# Patient Record
Sex: Male | Born: 1985 | Race: White | Hispanic: No | Smoking: Current every day smoker
Health system: Southern US, Community
[De-identification: ages and names within clinical notes are randomized; demographics above are authoritative.]

## PROBLEM LIST (undated history)

## (undated) DIAGNOSIS — S34109A Unspecified injury to unspecified level of lumbar spinal cord, initial encounter: Secondary | ICD-10-CM

## (undated) DIAGNOSIS — M549 Dorsalgia, unspecified: Secondary | ICD-10-CM

## (undated) DIAGNOSIS — J683 Other acute and subacute respiratory conditions due to chemicals, gases, fumes and vapors: Secondary | ICD-10-CM

## (undated) DIAGNOSIS — F909 Attention-deficit hyperactivity disorder, unspecified type: Secondary | ICD-10-CM

## (undated) DIAGNOSIS — S32008A Other fracture of unspecified lumbar vertebra, initial encounter for closed fracture: Secondary | ICD-10-CM

## (undated) DIAGNOSIS — F1110 Opioid abuse, uncomplicated: Secondary | ICD-10-CM

## (undated) DIAGNOSIS — A879 Viral meningitis, unspecified: Secondary | ICD-10-CM

## (undated) HISTORY — DX: Viral meningitis, unspecified: A87.9

## (undated) HISTORY — DX: Unspecified injury to unspecified level of lumbar spinal cord, initial encounter: S34.109A

## (undated) HISTORY — DX: Dorsalgia, unspecified: M54.9

## (undated) HISTORY — DX: Other acute and subacute respiratory conditions due to chemicals, gases, fumes and vapors: J68.3

## (undated) HISTORY — DX: Other fracture of unspecified lumbar vertebra, initial encounter for closed fracture: S32.008A

---
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
## 2006-07-22 ENCOUNTER — Emergency Department: Payer: Self-pay | Admitting: Emergency Medicine

## 2007-03-14 ENCOUNTER — Emergency Department (HOSPITAL_COMMUNITY): Admission: EM | Admit: 2007-03-14 | Discharge: 2007-03-14 | Payer: Self-pay | Admitting: Emergency Medicine

## 2008-04-28 ENCOUNTER — Emergency Department (HOSPITAL_COMMUNITY): Admission: EM | Admit: 2008-04-28 | Discharge: 2008-04-28 | Payer: Self-pay | Admitting: Emergency Medicine

## 2008-04-29 ENCOUNTER — Emergency Department (HOSPITAL_COMMUNITY): Admission: EM | Admit: 2008-04-29 | Discharge: 2008-04-29 | Payer: Self-pay | Admitting: Emergency Medicine

## 2008-07-24 ENCOUNTER — Ambulatory Visit: Payer: Self-pay | Admitting: Pain Medicine

## 2008-07-27 ENCOUNTER — Ambulatory Visit: Payer: Self-pay | Admitting: Pain Medicine

## 2008-08-15 ENCOUNTER — Ambulatory Visit: Payer: Self-pay | Admitting: Physician Assistant

## 2008-08-24 ENCOUNTER — Ambulatory Visit: Payer: Self-pay | Admitting: Pain Medicine

## 2008-09-08 DIAGNOSIS — S32008A Other fracture of unspecified lumbar vertebra, initial encounter for closed fracture: Secondary | ICD-10-CM

## 2008-09-08 DIAGNOSIS — S34109A Unspecified injury to unspecified level of lumbar spinal cord, initial encounter: Secondary | ICD-10-CM

## 2008-09-08 HISTORY — DX: Other fracture of unspecified lumbar vertebra, initial encounter for closed fracture: S34.109A

## 2008-09-08 HISTORY — DX: Other fracture of unspecified lumbar vertebra, initial encounter for closed fracture: S32.008A

## 2008-09-26 ENCOUNTER — Ambulatory Visit: Payer: Self-pay | Admitting: Physician Assistant

## 2008-10-23 ENCOUNTER — Ambulatory Visit: Payer: Self-pay | Admitting: Pain Medicine

## 2008-11-02 ENCOUNTER — Ambulatory Visit: Payer: Self-pay | Admitting: Pain Medicine

## 2008-11-23 ENCOUNTER — Ambulatory Visit: Payer: Self-pay | Admitting: Physician Assistant

## 2008-12-20 ENCOUNTER — Ambulatory Visit: Payer: Self-pay | Admitting: Physician Assistant

## 2009-02-06 ENCOUNTER — Ambulatory Visit: Payer: Self-pay | Admitting: Pain Medicine

## 2009-03-05 ENCOUNTER — Ambulatory Visit: Payer: Self-pay | Admitting: Pain Medicine

## 2009-04-02 ENCOUNTER — Ambulatory Visit: Payer: Self-pay | Admitting: Pain Medicine

## 2009-04-30 ENCOUNTER — Ambulatory Visit: Payer: Self-pay | Admitting: Pain Medicine

## 2009-07-02 ENCOUNTER — Ambulatory Visit: Payer: Self-pay | Admitting: Physician Assistant

## 2009-10-29 ENCOUNTER — Ambulatory Visit: Payer: Self-pay | Admitting: Pain Medicine

## 2009-10-29 ENCOUNTER — Encounter: Payer: Self-pay | Admitting: Family Medicine

## 2009-11-18 ENCOUNTER — Emergency Department (HOSPITAL_COMMUNITY): Admission: EM | Admit: 2009-11-18 | Discharge: 2009-11-19 | Payer: Self-pay | Admitting: Emergency Medicine

## 2010-02-07 ENCOUNTER — Ambulatory Visit: Payer: Self-pay | Admitting: Family Medicine

## 2010-02-07 DIAGNOSIS — G8929 Other chronic pain: Secondary | ICD-10-CM | POA: Insufficient documentation

## 2010-02-07 DIAGNOSIS — M549 Dorsalgia, unspecified: Secondary | ICD-10-CM

## 2010-03-07 ENCOUNTER — Telehealth: Payer: Self-pay | Admitting: Family Medicine

## 2010-04-04 ENCOUNTER — Telehealth: Payer: Self-pay | Admitting: Family Medicine

## 2010-05-08 ENCOUNTER — Telehealth: Payer: Self-pay | Admitting: Family Medicine

## 2010-06-07 ENCOUNTER — Telehealth: Payer: Self-pay | Admitting: Family Medicine

## 2010-07-05 ENCOUNTER — Telehealth: Payer: Self-pay | Admitting: Family Medicine

## 2010-08-07 ENCOUNTER — Telehealth: Payer: Self-pay | Admitting: Family Medicine

## 2010-09-06 ENCOUNTER — Telehealth: Payer: Self-pay | Admitting: Family Medicine

## 2010-10-04 ENCOUNTER — Telehealth: Payer: Self-pay | Admitting: Family Medicine

## 2010-10-09 NOTE — Miscellaneous (Signed)
Summary: Controlled Substance Agreement  Controlled Substance Agreement   Imported By: Lanelle Bal 02/14/2010 09:08:02  _____________________________________________________________________  External Attachment:    Type:   Image     Comment:   External Document

## 2010-10-09 NOTE — Progress Notes (Signed)
Summary: hydrocodone   Phone Note Refill Request Message from:  Patient on August 07, 2010 9:31 AM  Refills Requested: Medication #1:  HYDROCODONE-ACETAMINOPHEN 5-325 MG TABS take 1-2 tabs up to three times a day as needed for pain. Please send to [facility]   Initial call taken by: Melody Comas,  August 07, 2010 9:31 AM  Follow-up for Phone Call        Pt is waiting in our waiting room and would like to pick up rx instead of having med called to [facility]. Pt is going out of town when leaves our office.Please advise. Lewanda Rife LPN  August 07, 2010 12:31 PM  printed in put in nurse in box for pickup  Follow-up by: Judith Part MD,  August 07, 2010 12:47 PM    New/Updated Medications: HYDROCODONE-ACETAMINOPHEN 5-325 MG TABS (HYDROCODONE-ACETAMINOPHEN) take 1-2 tabs up to three times a day as needed for pain Prescriptions: HYDROCODONE-ACETAMINOPHEN 5-325 MG TABS (HYDROCODONE-ACETAMINOPHEN) take 1-2 tabs up to three times a day as needed for pain  #180 x 0   Entered and Authorized by:   Judith Part MD   Signed by:   Judith Part MD on 08/07/2010   Method used:   Print then Give to Patient   RxID:   [id number]

## 2010-10-09 NOTE — Progress Notes (Signed)
Summary: hydrocodone  Phone Note Refill Request Message from:  Patient on April 04, 2010 3:43 PM  Refills Requested: Medication #1:  HYDROCODONE-ACETAMINOPHEN 5-325 MG TABS take 1-2 tabs up to three times a day as needed for pain.  Method Requested: Pick up at Office Initial call taken by: Melody Comas,  April 04, 2010 3:43 PM  Follow-up for Phone Call        printed in put in nurse in box for pickup  Follow-up by: Judith Part MD,  April 05, 2010 8:11 AM  Additional Follow-up for Phone Call Additional follow up Details #1::        Patient notified as instructed by telephone. Prescription left at front desk. Lewanda Rife LPN  April 05, 2010 8:19 AM     Prescriptions: HYDROCODONE-ACETAMINOPHEN 5-325 MG TABS (HYDROCODONE-ACETAMINOPHEN) take 1-2 tabs up to three times a day as needed for pain  #180 x 0   Entered and Authorized by:   Judith Part MD   Signed by:   Judith Part MD on 04/05/2010   Method used:   Print then Give to Patient   RxID:   [id number]

## 2010-10-09 NOTE — Progress Notes (Signed)
Summary: refill request for vicodin  Phone Note Refill Request Call back at [phone] Message from:  mother  Marice Potter Requested: Medication #1:  HYDROCODONE-ACETAMINOPHEN 5-325 MG TABS take 1-2 tabs up to three times a day as needed for pain. Please send to [facility], he will run out of this on sunday   Initial call taken by: Lowella Petties CMA, AAMA,  July 05, 2010 10:16 AM  Follow-up for Phone Call        please call in and notify patient. Follow-up by: Eustaquio Boyden  MD,  July 05, 2010 10:23 AM  Additional Follow-up for Phone Call Additional follow up Details #1::        Rx called in and patient's mother notified Additional Follow-up by: Janee Morn CMA Duncan Dull),  July 05, 2010 10:41 AM    Prescriptions: HYDROCODONE-ACETAMINOPHEN 5-325 MG TABS (HYDROCODONE-ACETAMINOPHEN) take 1-2 tabs up to three times a day as needed for pain  #180 x 0   Entered and Authorized by:   Eustaquio Boyden  MD   Signed by:   Eustaquio Boyden  MD on 07/05/2010   Method used:   Telephoned to ...         RxID:   [id number]

## 2010-10-09 NOTE — Letter (Signed)
Summary: Patient Questionnaire  Patient Questionnaire   Imported By: Beau Fanny 02/11/2010 13:55:30  _____________________________________________________________________  External Attachment:    Type:   Image     Comment:   External Document

## 2010-10-09 NOTE — Letter (Signed)
Summary: [facility]  [facility]   Imported By: Lanelle Bal 11/20/2009 08:25:56  _____________________________________________________________________  External Attachment:    Type:   Image     Comment:   External Document

## 2010-10-09 NOTE — Progress Notes (Signed)
Summary: Percocet  Phone Note Refill Request Call back at [phone], Jacob Newton, Wife Message from:  Patient on March 07, 2010 10:30 AM  Refills Requested: Medication #1:  HYDROCODONE-ACETAMINOPHEN 5-325 MG TABS take 1-2 tabs up to three times a day as needed for pain. Please call patient when prescription is ready for pickup.    Method Requested: Pick up at Office Initial call taken by: Delilah Shan CMA (AAMA),  March 07, 2010 10:30 AM  Follow-up for Phone Call        printed in put in nurse in box for pickup  Follow-up by: Judith Part MD,  March 07, 2010 1:03 PM  Additional Follow-up for Phone Call Additional follow up Details #1::        Left message for patient to call back. Prescription left at front desk. Lewanda Rife LPN  March 07, 2010 5:16 PM   Jacob Newton  notified as instructed by telephone. Lewanda Rife LPN  March 09, 4539 11:17 AM     Prescriptions: HYDROCODONE-ACETAMINOPHEN 5-325 MG TABS (HYDROCODONE-ACETAMINOPHEN) take 1-2 tabs up to three times a day as needed for pain  #180 x 0   Entered and Authorized by:   Judith Part MD   Signed by:   Judith Part MD on 03/07/2010   Method used:   Print then Give to Patient   RxID:   [id number]

## 2010-10-09 NOTE — Assessment & Plan Note (Signed)
Summary: NEW PT TO ESTABH/DLO   Vital Signs:  Patient profile:   25 year old male Height:      66.5 inches Weight:      167.25 pounds BMI:     26.69 Temp:     98.6 degrees F oral Pulse rate:   80 / minute Pulse rhythm:   regular BP sitting:   108 / 70  (right arm) Cuff size:   regular  Vitals Entered By: Lewanda Rife LPN (February 07, 1609 11:30 AM) CC: New pt to establish   History of Present Illness: here to est today  is tranferring also for pain control med from his pain Dr Shireen Quan- who is cutting down his practice pt has also seen Dr Ernest Pine for ortho   hx of low back pain from injury-- fell off a truck in 2009  fx a vertebrae - thinks it was L5  was on workmans comp- just settled for that  per letter from Dr Shireen Quan 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   has had injections - no relief  was told he was too young for surgery - but would consider only if a lot more severe (would have to have a fusion)   is on norco 325-5 orally 1-2 up to three times a day as needed  gets 180 pills per month - will be taking to [facility] or [facility]  out for a while - was due onthe 21st - had no pills  withdrawl for 1 day - not bad (he tolerates drug holidays well )  advil in addn to this- every now and then   his med has been stable approved to continue px under same terms without change or increase (if problems return for visit)  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    Allergies (verified): 1)  ! 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/ disc bulge/ arthropathy  hx of lumbar fracture in 2010 used to go to pain clinic  smoker  gets reactive airways with illnesses        Dr Shireen Quan - pain Dr as needed  ortho -- Dr Ernest Pine, Dr Sheppard Penton   Past Surgical History: Radio frequency for lumbar fracture 2010-  but no surgery  stitches once   Family History: Mother: Living High blood pressure Father: Living: Crohn's disease  Social History: Occupation:Electrician HS ed Married Current Smoker Alcohol use-yes- rarely / on holiday  Drug use-no Regular exercise-yes 4 children (wife stays at home)  Occupation:  employed Smoking Status:  current Drug Use:  no Does Patient Exercise:  yes  Review of Systems General:  Denies fatigue, loss of appetite, and malaise. Eyes:  Denies blurring and eye irritation. CV:  Denies chest pain or discomfort, palpitations, shortness of breath with exertion, and swelling of feet. Resp:  Denies cough, shortness of breath, and wheezing. GI:  Denies abdominal pain, bloody stools, change in bowel habits, and indigestion. GU:  Denies dysuria, hematuria, urinary frequency, and urinary hesitancy. MS:  Complains of low back pain and stiffness; denies muscle weakness. Derm:  Denies  itching, lesion(s), poor wound healing, and rash. Neuro:  Complains of numbness and tingling; in R leg from back injury- this happens while sitting and driving. Psych:  Denies anxiety and depression. Endo:  Denies excessive thirst and excessive urination.  Physical Exam  General:  Well-developed,well-nourished,in no acute distress; alert,appropriate and cooperative throughout examination Head:  normocephalic, atraumatic, and no abnormalities observed.   Eyes:  vision grossly intact, pupils equal, pupils round, and pupils reactive to light.  no conjunctival pallor, injection or icterus  Mouth:  pharynx pink and moist.   Neck:  supple with full rom and no masses or thyromegally, no JVD or carotid bruit  Chest Wall:  No deformities, masses, tenderness or gynecomastia noted. Lungs:  Normal respiratory effort, chest expands symmetrically. Lungs are clear to auscultation, no crackles or wheezes. Heart:  Normal rate and regular rhythm. S1 and S2 normal without gallop, murmur, click, rub or other extra sounds. Abdomen:  Bowel sounds positive,abdomen soft and non-tender without masses, organomegaly or hernias noted. Msk:  tenderness over lower LS with limited rom  tender over R SI joint  nl rom leg and hip gait favors L leg slightly no acute joint changes  Pulses:  R and L carotid,radial,femoral,dorsalis pedis and posterior tibial pulses are full and equal bilaterally Extremities:  No clubbing, cyanosis, edema, or deformity noted with normal full range of motion of all joints.   Neurologic:  no tremor strength normal in all extremities, sensation intact to light touch, and DTRs symmetrical and normal.   Skin:  Intact without suspicious lesions or rashes tanned with some lentigos  Cervical Nodes:  No lymphadenopathy noted Inguinal Nodes:  No significant adenopathy Psych:  normal affect, talkative and pleasant  here with supportive wife today   Impression & Recommendations:  Problem # 1:  BACK  PAIN, CHRONIC (ICD-724.5) Assessment New here to transfer care and med management from pain clinic for chronic back pain after trauma spent 20 min rev old records- pt has been reliable with his pain meds - without much withdrawl if he stops  disc habit forming and sedating potential - he assures me this does not affect his job  disc return to Dr Herminio Heads if symptoms worsen (? consider fusion if worse)  did fill 1 mo of hydrocodone - 180 pills and pt signed controlled subst contract today understands method of calling for refils - will need to call 2 days ahead and mind our guidelines will return for health mt exam when he gets insurance  His updated medication list for this problem includes:    Hydrocodone-acetaminophen 5-325 Mg Tabs (Hydrocodone-acetaminophen) .Marland Kitchen... Take 1-2 tabs up to three  times a day as needed for pain  Complete Medication List: 1)  Hydrocodone-acetaminophen 5-325 Mg Tabs (Hydrocodone-acetaminophen) .... Take 1-2 tabs up to three times a day as needed for pain  Patient Instructions: 1)  no change in medication 2)  for monthly refils - call at least 2 days before refil as needed  3)  update me if pain or other symptoms worsen  4)  come back for physical when you are interested  5)  keep thinking about quitting smoking  6)  let me know if you are interested in zyban in the future  Prescriptions: HYDROCODONE-ACETAMINOPHEN 5-325 MG TABS (HYDROCODONE-ACETAMINOPHEN) take 1-2 tabs up to three times a day as needed for pain  #180 x 0   Entered and Authorized by:   Judith Part MD   Signed by:   Judith Part MD on 02/07/2010   Method used:   Print then Give to Patient   RxID:   [id number]   Current Allergies (reviewed today): ! PERCOCET   Immunization History:  Tetanus/Td Immunization History:    Tetanus/Td:  historical (08/07/2006)

## 2010-10-09 NOTE — Progress Notes (Signed)
Summary: vicodin  Phone Note Refill Request Message from:  Patient on June 07, 2010 9:30 AM  Refills Requested: Medication #1:  HYDROCODONE-ACETAMINOPHEN 5-325 MG TABS take 1-2 tabs up to three times a day as needed for pain. Uses [facility]  Initial call taken by: Melody Comas,  June 07, 2010 9:30 AM  Follow-up for Phone Call        px written on EMR for call in  Follow-up by:  Part MD,  June 07, 2010 12:08 PM  Additional Follow-up for Phone Call Additional follow up Details #1::        Medication phoned to [facility] pharmacy as instructed. Patient notified as instructed by telephone. Lewanda Rife LPN  June 07, 2010 12:53 PM     Prescriptions: HYDROCODONE-ACETAMINOPHEN 5-325 MG TABS (HYDROCODONE-ACETAMINOPHEN) take 1-2 tabs up to three times a day as needed for pain  #180 x 0   Entered and Authorized by:    Part MD   Signed by:   Lewanda Rife LPN on 21/30/8657   Method used:   Telephoned to ...       [facility]* (retail)       [address]       [place], [place]  [address]       Ph: [phone]       Fax: [phone]   RxID:   [id number]

## 2010-10-09 NOTE — Progress Notes (Signed)
Summary: refill refill request for vicodin  Phone Note Refill Request Call back at Home Phone [phone] Message from:  Patient  Refills Requested: Medication #1:  HYDROCODONE-ACETAMINOPHEN 5-325 MG TABS take 1-2 tabs up to three times a day as needed for pain. Phoned request from pt, please send to [facility].  Initial call taken by: Lowella Petties CMA,  May 08, 2010 4:39 PM  Follow-up for Phone Call        I think he usually picks this up printed in put in nurse in box for pickup  Follow-up by: Judith Part MD,  May 08, 2010 4:52 PM  Additional Follow-up for Phone Call Additional follow up Details #1::        Left message for patient to call back. Dr Milinda Antis said pt should pick up rx.Prescription left at front desk.Lewanda Rife LPN  May 08, 2010 5:05 PM  Advised pt script is ready.  Additional Follow-up by: Lowella Petties CMA,  May 09, 2010 8:15 AM    Prescriptions: HYDROCODONE-ACETAMINOPHEN 5-325 MG TABS (HYDROCODONE-ACETAMINOPHEN) take 1-2 tabs up to three times a day as needed for pain  #180 x 0   Entered and Authorized by:   Judith Part MD   Signed by:   Judith Part MD on 05/08/2010   Method used:   Print then Give to Patient   RxID:   [id number]

## 2010-10-10 NOTE — Progress Notes (Signed)
Summary: refill request for vicodin  Phone Note Refill Request Call back at Home Phone [phone] Message from:  Patient  Refills Requested: Medication #1:  HYDROCODONE-ACETAMINOPHEN 5-325 MG TABS take 1-2 tabs up to three times a day as needed for pain. Phoned request from pt, please send to [facility].  Initial call taken by: Lowella Petties CMA, AAMA,  September 06, 2010 9:18 AM  Follow-up for Phone Call        px written on EMR for call in  Follow-up by: Judith Part MD,  September 06, 2010 1:17 PM  Additional Follow-up for Phone Call Additional follow up Details #1::        Rx phoned to pharmacy.  Additional Follow-up by: Melody Comas,  September 06, 2010 2:23 PM    New/Updated Medications: HYDROCODONE-ACETAMINOPHEN 5-325 MG TABS (HYDROCODONE-ACETAMINOPHEN) take 1-2 tabs up to three times a day as needed for pain Prescriptions: HYDROCODONE-ACETAMINOPHEN 5-325 MG TABS (HYDROCODONE-ACETAMINOPHEN) take 1-2 tabs up to three times a day as needed for pain  #180 x 0   Entered and Authorized by:   Judith Part MD   Signed by:   Lewanda Rife LPN on 09/81/1914   Method used:   Telephoned to ...         RxID:   [id number]

## 2010-10-10 NOTE — Progress Notes (Signed)
Summary: hydrocodone  Phone Note Refill Request Call back at Home Phone [phone] Message from:  Fax from Pharmacy on October 04, 2010 8:56 AM  Refills Requested: Medication #1:  HYDROCODONE-ACETAMINOPHEN 5-325 MG TABS take 1-2 tabs up to three times a day as needed for pain. Patient is asking if he can get this fille a couple of days early because he will be going out fo town and for the [employer] and through next week. He would like it called in to [place].   Initial call taken by: Melody Comas,  October 04, 2010 8:57 AM  Follow-up for Phone Call        px written on EMR for call in  Follow-up by: Judith Part MD,  October 04, 2010 11:12 AM  Additional Follow-up for Phone Call Additional follow up Details #1::        Medication phoned to [facility] pharmacy as instructed. Patient notified as instructed by telephone. Lewanda Rife LPN  October 04, 2010 12:42 PM     New/Updated Medications: HYDROCODONE-ACETAMINOPHEN 5-325 MG TABS (HYDROCODONE-ACETAMINOPHEN) take 1-2 tabs up to three times a day as needed for pain Prescriptions: HYDROCODONE-ACETAMINOPHEN 5-325 MG TABS (HYDROCODONE-ACETAMINOPHEN) take 1-2 tabs up to three times a day as needed for pain  #180 x 3   Entered and Authorized by:   Judith Part MD   Signed by:   Lewanda Rife LPN on 08/65/7846   Method used:   Telephoned to ...         RxID:   [id number]

## 2010-11-07 ENCOUNTER — Telehealth: Payer: Self-pay | Admitting: Family Medicine

## 2010-11-14 NOTE — Progress Notes (Signed)
Summary: refill request for vicodin  Phone Note Refill Request Call back at Home Phone [phone] Message from:  Patient  Refills Requested: Medication #1:  HYDROCODONE-ACETAMINOPHEN 5-325 MG TABS take 1-2 tabs up to three times a day as needed for pain. Please call to [facility].  Pt will run out on friday.  Initial call taken by: Lowella Petties CMA, AAMA,  November 07, 2010 12:20 PM  Follow-up for Phone Call        px written on EMR for call in  Follow-up by: Judith Part MD,  November 08, 2010 8:10 AM  Additional Follow-up for Phone Call Additional follow up Details #1::        Called to [facility], advised pt. Additional Follow-up by: Lowella Petties CMA, AAMA,  November 08, 2010 12:40 PM    Prescriptions: HYDROCODONE-ACETAMINOPHEN 5-325 MG TABS (HYDROCODONE-ACETAMINOPHEN) take 1-2 tabs up to three times a day as needed for pain  #180 x 3   Entered and Authorized by:   Judith Part MD   Signed by:   Judith Part MD on 11/08/2010   Method used:   Telephoned to ...         RxID:   [id number]

## 2010-12-06 ENCOUNTER — Other Ambulatory Visit: Payer: Self-pay | Admitting: *Deleted

## 2010-12-06 MED ORDER — HYDROCODONE-ACETAMINOPHEN 5-325 MG PO TABS: ORAL_TABLET | ORAL | Status: DC

## 2010-12-06 NOTE — Telephone Encounter (Signed)
May call in #180, RF 0.  Seems to be using 2 tid scheduled.  Will forward to Dr. Milinda Antis as Lorain Childes.

## 2010-12-06 NOTE — Telephone Encounter (Signed)
Rx called in as directed and message left notifying patient. 

## 2011-01-03 ENCOUNTER — Other Ambulatory Visit: Payer: Self-pay | Admitting: *Deleted

## 2011-01-03 MED ORDER — HYDROCODONE-ACETAMINOPHEN 5-325 MG PO TABS: ORAL_TABLET | ORAL | Status: DC

## 2011-01-03 NOTE — Telephone Encounter (Signed)
Px written for call in   

## 2011-01-03 NOTE — Telephone Encounter (Signed)
Medication phoned to [facility] as instructed.  

## 2011-01-31 ENCOUNTER — Other Ambulatory Visit: Payer: Self-pay | Admitting: *Deleted

## 2011-01-31 MED ORDER — HYDROCODONE-ACETAMINOPHEN 5-325 MG PO TABS: ORAL_TABLET | ORAL | Status: DC

## 2011-01-31 NOTE — Telephone Encounter (Signed)
Px written for call in   

## 2011-01-31 NOTE — Telephone Encounter (Signed)
Patient notified as instructed by telephone.Medication phoned to [facility] pharmacy as instructed.  

## 2011-02-26 ENCOUNTER — Other Ambulatory Visit: Payer: Self-pay | Admitting: *Deleted

## 2011-02-26 MED ORDER — HYDROCODONE-ACETAMINOPHEN 5-325 MG PO TABS: ORAL_TABLET | ORAL | Status: DC

## 2011-02-26 NOTE — Telephone Encounter (Signed)
Pt's wife is coming in for appt this morning and would like to pick up script then.

## 2011-02-26 NOTE — Telephone Encounter (Signed)
Px written for call in   

## 2011-02-26 NOTE — Telephone Encounter (Signed)
Rx called to pharmacy as instructed. 

## 2011-04-03 ENCOUNTER — Telehealth: Payer: Self-pay

## 2011-04-03 MED ORDER — HYDROCODONE-ACETAMINOPHEN 5-325 MG PO TABS: ORAL_TABLET | ORAL | Status: DC

## 2011-04-03 NOTE — Telephone Encounter (Signed)
Px written for call in   

## 2011-04-03 NOTE — Telephone Encounter (Signed)
Pt's wife ERica called to request Hydrocodone/APAP 5-325mg  to be called into [facility] pharmacy. Alcario Drought understands DR Milinda Antis is out of office today and will ck with [facility] pharmacy 04/04/11 in the afternoon if she has not heard back from our office. Contact [phone]. Pt will be going out of town this weekend and would like to pick up med Friday.

## 2011-04-04 NOTE — Telephone Encounter (Signed)
Thanks - yes , of course

## 2011-04-04 NOTE — Telephone Encounter (Signed)
Spoke with Casie at [facility] and pt still has 2 refills left on Hydrocodone/APAP so I did not leave this refill. Is that OK?

## 2011-05-01 ENCOUNTER — Other Ambulatory Visit: Payer: Self-pay | Admitting: *Deleted

## 2011-05-01 MED ORDER — HYDROCODONE-ACETAMINOPHEN 5-325 MG PO TABS: ORAL_TABLET | ORAL | Status: DC

## 2011-05-01 NOTE — Telephone Encounter (Signed)
This is not due to be refilled until 8/30 but wife states pt is going out of town on Monday and will not be back till the end of September- going out of town for his job.  [facility]ses [facility].

## 2011-05-01 NOTE — Telephone Encounter (Signed)
Rx called in as directed and patient notified.  

## 2011-05-01 NOTE — Telephone Encounter (Signed)
That is fine  Px written for call in   

## 2011-06-04 ENCOUNTER — Other Ambulatory Visit: Payer: Self-pay | Admitting: Family Medicine

## 2011-06-04 MED ORDER — HYDROCODONE-ACETAMINOPHEN 5-325 MG PO TABS: ORAL_TABLET | ORAL | Status: DC

## 2011-06-04 NOTE — Telephone Encounter (Signed)
Spoke with pt's wife and he is not out of medication/(Hydrocodone) yet but will probably be out Fri. Pt will ck with [facility] tomorrow afternoon to see if refill is there.

## 2011-06-04 NOTE — Telephone Encounter (Signed)
Px written for call in   

## 2011-06-04 NOTE — Telephone Encounter (Signed)
Wife called to request hydrocodone refill for husband.  Call back [phone]

## 2011-06-04 NOTE — Telephone Encounter (Signed)
Medication phoned to [facility] pharmacy as instructed.Patient's wife notified as instructed by telephone.

## 2011-07-02 ENCOUNTER — Other Ambulatory Visit: Payer: Self-pay | Admitting: *Deleted

## 2011-07-02 MED ORDER — HYDROCODONE-ACETAMINOPHEN 5-325 MG PO TABS: ORAL_TABLET | ORAL | Status: DC

## 2011-07-02 NOTE — Telephone Encounter (Signed)
Phoned request from patient, uses [facility].

## 2011-07-02 NOTE — Telephone Encounter (Signed)
Rx called in as directed.   

## 2011-07-02 NOTE — Telephone Encounter (Signed)
Px written for call in   

## 2011-07-29 ENCOUNTER — Other Ambulatory Visit: Payer: Self-pay

## 2011-07-29 MED ORDER — HYDROCODONE-ACETAMINOPHEN 5-325 MG PO TABS: ORAL_TABLET | ORAL | Status: DC

## 2011-07-29 NOTE — Telephone Encounter (Signed)
[facility] faxed refill request Hydrocodone APAP 5-325 mg.Last filled 07/02/11. Pt last seen 02/07/10.

## 2011-07-29 NOTE — Telephone Encounter (Signed)
He is due for a yearly office visit - please schedule Px written for call in

## 2011-07-29 NOTE — Telephone Encounter (Signed)
Medication phoned to [facility] pharmacy as instructed.Pt scheduled appt 08/22/11 at 9am. Pt will schedule CPX after gets insurance sometime in Jan or Feb 2013.

## 2011-08-22 ENCOUNTER — Encounter: Payer: Self-pay | Admitting: Family Medicine

## 2011-08-22 ENCOUNTER — Ambulatory Visit (INDEPENDENT_AMBULATORY_CARE_PROVIDER_SITE_OTHER): Payer: Self-pay | Admitting: Family Medicine

## 2011-08-22 VITALS — BP 118/64 | HR 76 | Temp 98.0°F | Ht 66.5 in | Wt 162.8 lb

## 2011-08-22 DIAGNOSIS — F17200 Nicotine dependence, unspecified, uncomplicated: Secondary | ICD-10-CM | POA: Insufficient documentation

## 2011-08-22 DIAGNOSIS — M549 Dorsalgia, unspecified: Secondary | ICD-10-CM

## 2011-08-22 MED ORDER — HYDROCODONE-ACETAMINOPHEN 5-325 MG PO TABS: ORAL_TABLET | ORAL | Status: DC

## 2011-08-22 NOTE — Progress Notes (Signed)
Subjective:    Patient ID: Jacob Newton, male    DOB: December 14, 1985, 25 y.o.   MRN: [id number]  HPI Here for f/u of back pain and med management  Wt is down 5 lb with bmi of 25  Chronic back pain  Hx of closed lumbar fx with cord injury Pain never gets any better -- sometimes worse  In lower back r side -- and radiates down his leg  Larey Seat and landed on his back in 2009 - from a truck moving furniture  ? Which vertebrae broke Did not do surgery due to age  Does not see any specialists   Takes the norco - anywhere from 6 to 8 per day  Dosing schedule usually takes every 4-5 hours - takes 2 at a time - does not make him loopy  Does not get withdrawal symptoms if he misses a dose   Last saw Dr Shireen Quan- tried injections and radiofrequency Was released because there was nothing else to do  He was told he was too young for surgery   1/2 ppd - wants to quit  Tried several times  Tried chantix - only worked a little and could not handle the nightmares Elect cig did not work well for him   Patient Active Problem List  Diagnoses  . BACK PAIN, CHRONIC  . Smoker   Past Medical History  Diagnosis Date  . Back pain     chronic low back pain, with L%-S1 stenosis/bulge sisc/arthropathy  . Closed lumbar fx w/ cord inj 2010    hx lumbar fx  . Reactive airways dysfunction syndrome     with illnesses   No past surgical history on file. History  Substance Use Topics  . Smoking status: Current Everyday Smoker  . Smokeless tobacco: Not on file  . Alcohol Use: Yes     rarely on holidays   Family History  Problem Relation Age of Onset  . Hypertension Mother    Allergies  Allergen Reactions  . Chantix (Varenicline Tartrate) Other (See Comments)    Bad dreams  . Oxycodone-Acetaminophen     REACTION: N\T\V   No current outpatient prescriptions on file prior to visit.      Review of Systems Review of Systems  Constitutional: Negative for fever, appetite change, fatigue and  unexpected weight change.  Eyes: Negative for pain and visual disturbance.  Respiratory: Negative for cough and shortness of breath.   Cardiovascular: Negative for cp or palpitations    Gastrointestinal: Negative for nausea, diarrhea and constipation.  Genitourinary: Negative for urgency and frequency.  Skin: Negative for pallor or rash   MSK pos for chronic back and leg pain  Neurological: Negative for weakness, light-headedness, numbness and headaches.  Hematological: Negative for adenopathy. Does not bruise/bleed easily.  Psychiatric/Behavioral: Negative for dysphoric mood. The patient is not nervous/anxious.          Objective:   Physical Exam  Constitutional: He appears well-developed and well-nourished. No distress.  HENT:  Head: Normocephalic and atraumatic.  Mouth/Throat: Oropharynx is clear and moist.  Eyes: Conjunctivae are normal. Pupils are equal, round, and reactive to light.  Neck: Normal range of motion. Neck supple. No JVD present. Carotid bruit is not present. No thyromegaly present.  Cardiovascular: Normal rate, regular rhythm, normal heart sounds and intact distal pulses.   Pulmonary/Chest: Effort normal and breath sounds normal. No respiratory distress. He has no wheezes.       Diffusely distant bs   Abdominal: Soft. Bowel sounds  are normal.  Musculoskeletal: He exhibits tenderness. He exhibits no edema.       Poor rom LS  Tender L4, L5 , S1 spinous processes  Limited flexion due to pain  Pos SLR on R for leg pain   Lymphadenopathy:    He has no cervical adenopathy.  Neurological: He is alert. He has normal strength and normal reflexes. He displays no atrophy. No cranial nerve deficit or sensory deficit. He exhibits normal muscle tone. Coordination normal.  Skin: Skin is warm and dry. No rash noted. No erythema. No pallor.  Psychiatric: He has a normal mood and affect.          Assessment & Plan:

## 2011-08-22 NOTE — Patient Instructions (Signed)
Refilled med today - get it filled at [facility]  A flu shot - ask at [facility] or the health dept Keep thinking about quitting smoking  Make appt for lab and physical when you get insurance

## 2011-08-22 NOTE — Assessment & Plan Note (Signed)
After injury in 09 - with tx by Dr Legrand Como in past - with PT / injections Not a surgical problem Rev use of norco Refilled 180 today- several days early due to work travel  Disc habit potential  / signs of withdrawal  Will f/u for PE when he has insurance

## 2011-08-22 NOTE — Assessment & Plan Note (Signed)
Disc in detail risks of smoking and possible outcomes including copd, vascular/ heart disease, cancer , respiratory and sinus infections  Pt voices understanding  Has tried several modalities to quit  Thinks he will have to do cold [place] Counseled on tricks to help

## 2011-09-24 ENCOUNTER — Other Ambulatory Visit: Payer: Self-pay | Admitting: Internal Medicine

## 2011-09-24 MED ORDER — HYDROCODONE-ACETAMINOPHEN 5-325 MG PO TABS: ORAL_TABLET | ORAL | Status: DC

## 2011-09-24 NOTE — Telephone Encounter (Signed)
Px written for call in   

## 2011-09-24 NOTE — Telephone Encounter (Signed)
Pharmacy refill request.  Last seen on 12.14.12

## 2011-09-24 NOTE — Telephone Encounter (Signed)
Medication phoned to [facility] as instructed.  

## 2011-11-19 ENCOUNTER — Other Ambulatory Visit: Payer: Self-pay

## 2011-11-19 NOTE — Telephone Encounter (Signed)
[facility] faxed request Hydrocodone apap 5-325 mg #180 and last filled 10/24/11.Please advise.

## 2011-11-20 MED ORDER — HYDROCODONE-ACETAMINOPHEN 5-325 MG PO TABS: ORAL_TABLET | ORAL | Status: DC

## 2011-11-20 NOTE — Telephone Encounter (Signed)
Px written for call in   

## 2011-11-20 NOTE — Telephone Encounter (Signed)
Medication phoned to [facility] as instructed.  

## 2011-12-17 ENCOUNTER — Other Ambulatory Visit: Payer: Self-pay | Admitting: *Deleted

## 2011-12-17 MED ORDER — HYDROCODONE-ACETAMINOPHEN 5-325 MG PO TABS: ORAL_TABLET | ORAL | Status: DC

## 2011-12-17 NOTE — Telephone Encounter (Signed)
Rx called to [facility]. 

## 2011-12-17 NOTE — Telephone Encounter (Signed)
Px written for call in   

## 2012-01-16 ENCOUNTER — Other Ambulatory Visit: Payer: Self-pay

## 2012-01-16 MED ORDER — HYDROCODONE-ACETAMINOPHEN 5-325 MG PO TABS: ORAL_TABLET | ORAL | Status: DC

## 2012-01-16 NOTE — Telephone Encounter (Signed)
Pts wife left v/m that [facility] was waiting on response to refill for Hydrocodone APAP 5-325 mg and wanted med filled today. I did not see in computer where we have received refill request. Pt can be reached at [phone].Pt last seen 08/22/11.Please advise.

## 2012-01-16 NOTE — Telephone Encounter (Signed)
Rx called in as directed.   

## 2012-01-16 NOTE — Telephone Encounter (Signed)
Px written for call in   

## 2012-01-30 ENCOUNTER — Other Ambulatory Visit: Payer: Self-pay

## 2012-02-12 ENCOUNTER — Telehealth: Payer: Self-pay | Admitting: Family Medicine

## 2012-02-12 DIAGNOSIS — Z0000 Encounter for general adult medical examination without abnormal findings: Secondary | ICD-10-CM | POA: Insufficient documentation

## 2012-02-12 NOTE — Telephone Encounter (Signed)
Message copied by Judy Pimple on Thu Feb 12, 2012 10:11 PM ------      Message from: Alvina Chou      Created: Fri Feb 06, 2012 11:07 AM      Regarding: lab orders for Friday, 6-7       Lab orders, no appt scheduled

## 2012-02-13 ENCOUNTER — Other Ambulatory Visit: Payer: Self-pay

## 2012-02-13 ENCOUNTER — Other Ambulatory Visit (INDEPENDENT_AMBULATORY_CARE_PROVIDER_SITE_OTHER): Payer: BC Managed Care – PPO

## 2012-02-13 DIAGNOSIS — Z13220 Encounter for screening for lipoid disorders: Secondary | ICD-10-CM

## 2012-02-13 DIAGNOSIS — Z0000 Encounter for general adult medical examination without abnormal findings: Secondary | ICD-10-CM

## 2012-02-13 LAB — CBC WITH DIFFERENTIAL/PLATELET
Basophils Absolute: 0 10*3/uL (ref 0.0–0.1)
Eosinophils Absolute: 0.1 10*3/uL (ref 0.0–0.7)
Eosinophils Relative: 1 % (ref 0–5)
HCT: 44.1 % (ref 39.0–52.0)
Hemoglobin: 15.4 g/dL (ref 13.0–17.0)
Lymphs Abs: 1.8 10*3/uL (ref 0.7–4.0)
MCV: 88.6 fL (ref 78.0–100.0)
Neutro Abs: 4.8 10*3/uL (ref 1.7–7.7)
RBC: 4.98 MIL/uL (ref 4.22–5.81)
WBC: 7 10*3/uL (ref 4.0–10.5)

## 2012-02-14 LAB — COMPREHENSIVE METABOLIC PANEL
ALT: 39 U/L (ref 0–53)
Albumin: 4.5 g/dL (ref 3.5–5.2)
CO2: 29 mEq/L (ref 19–32)
Potassium: 4 mEq/L (ref 3.5–5.3)
Total Bilirubin: 1.1 mg/dL (ref 0.3–1.2)
Total Protein: 6.5 g/dL (ref 6.0–8.3)

## 2012-02-14 LAB — LIPID PANEL
Cholesterol: 153 mg/dL (ref 0–200)
LDL Cholesterol: 73 mg/dL (ref 0–99)
Triglycerides: 174 mg/dL — ABNORMAL HIGH (ref <150)

## 2012-02-14 LAB — TSH: TSH: 0.602 u[IU]/mL (ref 0.350–4.500)

## 2012-02-19 ENCOUNTER — Other Ambulatory Visit: Payer: Self-pay | Admitting: Family Medicine

## 2012-02-19 ENCOUNTER — Other Ambulatory Visit: Payer: Self-pay

## 2012-02-19 NOTE — Telephone Encounter (Signed)
Discussed this case also with my partner Dr. Para March. I think the best course of action is to refill #30, 0 refills.  Then when Dr. Milinda Antis is back in the office on Monday, she can readdress, given her knowledge of the patient, and fill remainder of script to a pharmacy where the patient will be out of town.  History is that patient walked into the office at 5 PM and asked for his #180 Vicodin tablets now, leaving to go out of town in the morning. Refill attempt was made, verified through Rob at [facility], but did not reach our office.

## 2012-02-19 NOTE — Telephone Encounter (Signed)
Pt request Hydrocodone-APAP.Please advise in Dr Lucretia Roers absense.

## 2012-02-19 NOTE — Telephone Encounter (Signed)
Patient notified as instructed. Pt will call back on Monday with name and # of pharmacy where he will be.

## 2012-02-19 NOTE — Telephone Encounter (Signed)
Electronic refill request

## 2012-02-20 NOTE — Telephone Encounter (Signed)
I had asked Dr. Dayton Martes to refill this medicine this morning when I was working on [employer] top, which she o'k'd for 180.  I didn't see phone note below until now.  I called [facility] to cancel the approval for # 180 but they said pt was given 90 by Rob yesterday and he is to get the remaining 90 when he comes back to town.

## 2012-02-20 NOTE — Telephone Encounter (Signed)
Medicine called to [facility]. 

## 2012-02-20 NOTE — Telephone Encounter (Signed)
Thanks - I will fill the remainder of the px at a pharmacy where he is out of town or upon return if the 30 pills lasts the whole time  Let me know what pharmacy and send this back to me please  Thanks

## 2012-02-22 NOTE — Telephone Encounter (Signed)
Noted - will let Dr. Milinda Antis know

## 2012-02-23 ENCOUNTER — Encounter: Payer: Self-pay | Admitting: Family Medicine

## 2012-02-23 ENCOUNTER — Ambulatory Visit (INDEPENDENT_AMBULATORY_CARE_PROVIDER_SITE_OTHER): Payer: BC Managed Care – PPO | Admitting: Family Medicine

## 2012-02-23 VITALS — BP 112/62 | HR 79 | Temp 97.9°F | Ht 65.5 in | Wt 150.0 lb

## 2012-02-23 DIAGNOSIS — F17200 Nicotine dependence, unspecified, uncomplicated: Secondary | ICD-10-CM

## 2012-02-23 DIAGNOSIS — R41840 Attention and concentration deficit: Secondary | ICD-10-CM

## 2012-02-23 DIAGNOSIS — Z0000 Encounter for general adult medical examination without abnormal findings: Secondary | ICD-10-CM

## 2012-02-23 DIAGNOSIS — M549 Dorsalgia, unspecified: Secondary | ICD-10-CM

## 2012-02-23 MED ORDER — HYDROCODONE-ACETAMINOPHEN 5-325 MG PO TABS: ORAL_TABLET | ORAL | Status: DC

## 2012-02-23 NOTE — Assessment & Plan Note (Addendum)
Refilled px (partial given when I was out of town )  He has several back issues causing chronic pain  (hx of closed lumbar fx with cord inj) Per Dr Darrick Penna - his options are limited- inj do not work and sx not an option

## 2012-02-23 NOTE — Assessment & Plan Note (Signed)
Disc in detail risks of smoking and possible outcomes including copd, vascular/ heart disease, cancer , respiratory and sinus infections  Pt voices understanding  Pt is not at all ready to quit yet  Adv to let me know if he needs help to quit in the future

## 2012-02-23 NOTE — Assessment & Plan Note (Signed)
This is new to me - but lifelong- with poor performance in school and eventually HS drop out Per pt does affect his work and family life  Thinks he also has hyperactivity  Will refer to psychology for ADD testing If he does get a clinical dx -- ? If we could avoid stimulants (given his narcotic use) and go for a non stimulant tx like strattera or wellbutrin- will consider  I also wonder if chronic pain and narcotic use could add to his concentration issues

## 2012-02-23 NOTE — Telephone Encounter (Signed)
Ok- please let me know what he needs now re: px for the rest ? -thanks

## 2012-02-23 NOTE — Patient Instructions (Addendum)
Here is you pain medicine px to take to [facility]  We will do referral at check out to evaluate you for concentration issues Try to start cutting back on sugar/ sweets- choose fruit instead Stay active

## 2012-02-23 NOTE — Assessment & Plan Note (Signed)
Reviewed health habits including diet and exercise and skin cancer prevention Also reviewed health mt list, fam hx and immunizations   Wellness labs reviewed today  Disc borderline high sugar and need to cut sweets and eat more lean protein- will watch this

## 2012-02-23 NOTE — Progress Notes (Signed)
Subjective:    Patient ID: Jacob Newton, male    DOB: 03-08-1986, 26 y.o.   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 -- this affects his work (was a bigger problem when he used to climb Does not think mood plays a role at all -not depressed or anxious   Is interested in testing      bp good 112/62  Wt is down 12 lb -- not unusual for summer  bmi is 24  Also jobs are more active  Eats fair- some fast food , some fruit/ veg when home-- convenience eater Tries to eat salads at fast food places No extra exercise- but walking at work/ on feet all day   Td 07 No flu shot this year- no ins- will get one this fall   Smoker-- still smokes 1/2 ppd  No breathing problems or cough Does not want to quit yet   Labs ok overall   Chemistry      Component Value Date/Time   NA 144 02/13/2012 1613   K 4.0 02/13/2012 1613   CL 107 02/13/2012 1613   CO2 29 02/13/2012 1613   BUN 16 02/13/2012 1613   CREATININE 0.85 02/13/2012 1613      Component Value Date/Time   CALCIUM 9.7 02/13/2012 1613   ALKPHOS 68 02/13/2012 1613   AST 24 02/13/2012 1613   ALT 39 02/13/2012 1613   BILITOT 1.1 02/13/2012 1613      Lab Results  Component Value Date   WBC 7.0 02/13/2012   HGB 15.4 02/13/2012   HCT 44.1 02/13/2012   MCV 88.6 02/13/2012   PLT 268 02/13/2012    Lab Results  Component Value Date   TSH 0.602 02/13/2012   Lab Results  Component Value  Date   CHOL 153 02/13/2012   Lab Results  Component Value Date   HDL 45 02/13/2012   Lab Results  Component Value Date   LDLCALC 73 02/13/2012   Lab Results  Component Value Date   TRIG 174* 02/13/2012   Lab Results  Component Value Date   CHOLHDL 3.4 02/13/2012   No results found for this basename: LDLDIRECT   good LDL at 73 and HDL over 45  Sugar in one- teens - is a really big sweet eater  Needs teeth pulled    Patient Active Problem List  Diagnosis  . BACK PAIN, CHRONIC  . Smoker  . Routine general medical examination at a health care facility  . Poor concentration   Past Medical History  Diagnosis Date  . Back pain     chronic low back pain, with L%-S1 stenosis/bulge sisc/arthropathy  . Closed lumbar fx w/ cord inj 2010    hx lumbar fx  . Reactive airways dysfunction syndrome     with illnesses   No past surgical history on  file. History  Substance Use Topics  . Smoking status: Current Everyday Smoker  . Smokeless tobacco: Not on file  . Alcohol Use: Yes     rarely on holidays   Family History  Problem Relation Age of Onset  . Hypertension Mother    Allergies  Allergen Reactions  . Chantix (Varenicline Tartrate) Other (See Comments)    Bad dreams  . Oxycodone-Acetaminophen     REACTION: N\T\V   Current Outpatient Prescriptions on File Prior to Visit  Medication Sig Dispense Refill  . cetirizine (ZYRTEC) 10 MG tablet Take 10 mg by mouth daily as needed.         Review of Systems Review of Systems  Constitutional: Negative for fever, appetite change, fatigue and unexpected weight change.  Eyes: Negative for pain and visual disturbance.  Respiratory: Negative for cough and shortness of breath.   Cardiovascular: Negative for cp or palpitations    Gastrointestinal: Negative for nausea, diarrhea and constipation.  Genitourinary: Negative for urgency and frequency. neg for nocturia  Skin: Negative for pallor or rash   MSK pos for chronic back/ hip and  leg pain , neg for swollen joints Neurological: Negative for weakness, light-headedness, numbness and headaches.  Hematological: Negative for adenopathy. Does not bruise/bleed easily.  Psychiatric/Behavioral: Negative for dysphoric mood. The patient is not nervous/anxious.  pos for poor concentration and hyperactivity that has been life long        Objective:   Physical Exam  Constitutional: He appears well-developed and well-nourished. No distress.  HENT:  Head: Normocephalic and atraumatic.  Right Ear: External ear normal.  Left Ear: External ear normal.  Nose: Nose normal.  Mouth/Throat: Oropharynx is clear and moist.  Eyes: Conjunctivae and EOM are normal. Pupils are equal, round, and reactive to light. No scleral icterus.  Neck: Normal range of motion. Neck supple. Carotid bruit is not present. No thyromegaly present.  Cardiovascular: Normal rate, regular rhythm, normal heart sounds and intact distal pulses.  Exam reveals no gallop.   Pulmonary/Chest: Effort normal and breath sounds normal. No respiratory distress. He has no wheezes.  Abdominal: Soft. Bowel sounds are normal. He exhibits no distension, no abdominal bruit and no mass. There is no tenderness.  Musculoskeletal: He exhibits no edema and no tenderness.       Poor rom of LS  Lymphadenopathy:    He has no cervical adenopathy.  Neurological: He is alert. He has normal reflexes. He displays no tremor. No cranial nerve deficit. He exhibits normal muscle tone. Coordination normal.  Skin: Skin is warm and dry. No rash noted. No erythema. No pallor.  Psychiatric: He has a normal mood and affect. His speech is normal. Judgment and thought content normal. His mood appears not anxious. His affect is not blunt, not labile and not inappropriate. He does not exhibit a depressed mood. He exhibits normal recent memory and normal remote memory.       Eye contact is fair He is inattentive.          Assessment & Plan:

## 2012-02-23 NOTE — Telephone Encounter (Signed)
Patient seen in office today by MAT; Rx done at [facility]

## 2012-03-17 ENCOUNTER — Other Ambulatory Visit: Payer: Self-pay | Admitting: Family Medicine

## 2012-03-18 NOTE — Telephone Encounter (Signed)
Called in Rx as directed.  

## 2012-03-18 NOTE — Telephone Encounter (Signed)
Ok to refill? last OV was 02/23/12

## 2012-03-18 NOTE — Telephone Encounter (Signed)
Px written for call in   

## 2012-03-22 ENCOUNTER — Ambulatory Visit (INDEPENDENT_AMBULATORY_CARE_PROVIDER_SITE_OTHER): Payer: BC Managed Care – PPO | Admitting: Licensed Clinical Social Worker

## 2012-03-22 DIAGNOSIS — F4322 Adjustment disorder with anxiety: Secondary | ICD-10-CM

## 2012-03-29 ENCOUNTER — Ambulatory Visit (INDEPENDENT_AMBULATORY_CARE_PROVIDER_SITE_OTHER): Payer: BC Managed Care – PPO | Admitting: Licensed Clinical Social Worker

## 2012-03-29 DIAGNOSIS — F4323 Adjustment disorder with mixed anxiety and depressed mood: Secondary | ICD-10-CM

## 2012-04-19 ENCOUNTER — Other Ambulatory Visit: Payer: Self-pay | Admitting: Family Medicine

## 2012-04-19 NOTE — Telephone Encounter (Signed)
Medication phoned to pharmacy.  

## 2012-04-19 NOTE — Telephone Encounter (Signed)
Request for hydrocodone- acetaminophen . Last filled 03/18/12. Last OV was 02/23/12. Ok to refill?

## 2012-04-19 NOTE — Telephone Encounter (Signed)
Px written for call in   

## 2012-05-11 ENCOUNTER — Encounter (HOSPITAL_COMMUNITY): Payer: Self-pay | Admitting: *Deleted

## 2012-05-11 ENCOUNTER — Emergency Department (HOSPITAL_COMMUNITY)
Admission: EM | Admit: 2012-05-11 | Discharge: 2012-05-11 | Disposition: A | Discharge status: Home / Self Care | Payer: BC Managed Care – PPO | Attending: Emergency Medicine | Admitting: Emergency Medicine

## 2012-05-11 ENCOUNTER — Ambulatory Visit (INDEPENDENT_AMBULATORY_CARE_PROVIDER_SITE_OTHER): Payer: BC Managed Care – PPO | Admitting: Family Medicine

## 2012-05-11 ENCOUNTER — Encounter: Payer: Self-pay | Admitting: Family Medicine

## 2012-05-11 VITALS — BP 130/80 | HR 120 | Temp 98.2°F | Resp 14 | Wt 145.8 lb

## 2012-05-11 DIAGNOSIS — R509 Fever, unspecified: Secondary | ICD-10-CM | POA: Insufficient documentation

## 2012-05-11 DIAGNOSIS — R51 Headache: Secondary | ICD-10-CM

## 2012-05-11 DIAGNOSIS — F17200 Nicotine dependence, unspecified, uncomplicated: Secondary | ICD-10-CM | POA: Insufficient documentation

## 2012-05-11 DIAGNOSIS — M436 Torticollis: Secondary | ICD-10-CM | POA: Insufficient documentation

## 2012-05-11 DIAGNOSIS — R5381 Other malaise: Secondary | ICD-10-CM | POA: Insufficient documentation

## 2012-05-11 DIAGNOSIS — R519 Headache, unspecified: Secondary | ICD-10-CM | POA: Insufficient documentation

## 2012-05-11 LAB — CBC WITH DIFFERENTIAL/PLATELET
Basophils Absolute: 0 10*3/uL (ref 0.0–0.1)
Basophils Relative: 1 % (ref 0–1)
Eosinophils Absolute: 0 10*3/uL (ref 0.0–0.7)
Eosinophils Relative: 0 % (ref 0–5)
HCT: 39.7 % (ref 39.0–52.0)
Hemoglobin: 14.6 g/dL (ref 13.0–17.0)
Lymphocytes Relative: 49 % — ABNORMAL HIGH (ref 12–46)
Lymphs Abs: 2.6 10*3/uL (ref 0.7–4.0)
MCH: 30.8 pg (ref 26.0–34.0)
MCHC: 36.8 g/dL — ABNORMAL HIGH (ref 30.0–36.0)
MCV: 83.8 fL (ref 78.0–100.0)
Monocytes Absolute: 0.4 10*3/uL (ref 0.1–1.0)
Monocytes Relative: 7 % (ref 3–12)
Neutro Abs: 2.3 10*3/uL (ref 1.7–7.7)
Neutrophils Relative %: 43 % (ref 43–77)
Platelets: 195 10*3/uL (ref 150–400)
RBC: 4.74 MIL/uL (ref 4.22–5.81)
RDW: 12.5 % (ref 11.5–15.5)
WBC: 5.4 10*3/uL (ref 4.0–10.5)

## 2012-05-11 LAB — PROTEIN, CSF: Total  Protein, CSF: 71 mg/dL — ABNORMAL HIGH (ref 15–45)

## 2012-05-11 LAB — BASIC METABOLIC PANEL
BUN: 11 mg/dL (ref 6–23)
CO2: 28 mEq/L (ref 19–32)
Calcium: 9.3 mg/dL (ref 8.4–10.5)
Chloride: 103 mEq/L (ref 96–112)
Creatinine, Ser: 0.83 mg/dL (ref 0.50–1.35)
GFR calc Af Amer: >90 mL/min (ref >90)
GFR calc non Af Amer: >90 mL/min (ref >90)
Glucose, Bld: 91 mg/dL (ref 70–99)
Potassium: 3.4 mEq/L — ABNORMAL LOW (ref 3.5–5.1)
Sodium: 142 mEq/L (ref 135–145)

## 2012-05-11 LAB — CSF CELL COUNT WITH DIFFERENTIAL
RBC Count, CSF: 780 /mm3 — ABNORMAL HIGH
Tube #: 1
WBC, CSF: 1 /mm3 (ref 0–5)

## 2012-05-11 LAB — GRAM STAIN: Gram Stain: NONE SEEN

## 2012-05-11 LAB — GLUCOSE, CSF: Glucose, CSF: 61 mg/dL (ref 43–76)

## 2012-05-11 MED ORDER — MORPHINE SULFATE 4 MG/ML IJ SOLN
4.0000 mg | Freq: Once | INTRAMUSCULAR | Status: AC
  Administered 2012-05-11: 4 mg via INTRAVENOUS
  Filled 2012-05-11: qty 1

## 2012-05-11 MED ORDER — KETOROLAC TROMETHAMINE 30 MG/ML IJ SOLN
INTRAMUSCULAR | Status: AC
  Filled 2012-05-11: qty 1

## 2012-05-11 MED ORDER — DEXTROSE 5 % IV SOLN
1.0000 g | Freq: Once | INTRAVENOUS | Status: AC
  Administered 2012-05-11: 1 g via INTRAVENOUS
  Filled 2012-05-11 (×2): qty 10

## 2012-05-11 MED ORDER — KETOROLAC TROMETHAMINE 15 MG/ML IJ SOLN
15.0000 mg | Freq: Once | INTRAMUSCULAR | Status: AC
  Administered 2012-05-11: 15 mg via INTRAVENOUS
  Filled 2012-05-11: qty 1

## 2012-05-11 MED ORDER — VANCOMYCIN HCL IN DEXTROSE 1-5 GM/200ML-% IV SOLN
1000.0000 mg | Freq: Once | INTRAVENOUS | Status: AC
  Administered 2012-05-11: 1000 mg via INTRAVENOUS
  Filled 2012-05-11: qty 200

## 2012-05-11 MED ORDER — SODIUM CHLORIDE 0.9 % IV BOLUS (SEPSIS)
1000.0000 mL | Freq: Once | INTRAVENOUS | Status: AC
  Administered 2012-05-11: 1000 mL via INTRAVENOUS

## 2012-05-11 MED ORDER — ONDANSETRON HCL 4 MG/2ML IJ SOLN
4.0000 mg | Freq: Once | INTRAMUSCULAR | Status: AC
  Administered 2012-05-11: 4 mg via INTRAVENOUS
  Filled 2012-05-11: qty 2

## 2012-05-11 MED ORDER — MORPHINE SULFATE 4 MG/ML IJ SOLN
6.0000 mg | Freq: Once | INTRAMUSCULAR | Status: AC
  Administered 2012-05-11: 6 mg via INTRAVENOUS
  Filled 2012-05-11: qty 2

## 2012-05-11 MED ORDER — FENTANYL CITRATE 0.05 MG/ML IJ SOLN
50.0000 ug | Freq: Once | INTRAMUSCULAR | Status: AC
  Administered 2012-05-11: 15:00:00 via INTRAVENOUS
  Filled 2012-05-11: qty 2

## 2012-05-11 MED ORDER — DEXTROSE 5 % IV SOLN
1.0000 g | Freq: Once | INTRAVENOUS | Status: AC
  Administered 2012-05-11: 1 g via INTRAVENOUS

## 2012-05-11 NOTE — Patient Instructions (Addendum)
Please go to [facility] - I am concerned with the combination of fever/ headache and neck stiffness

## 2012-05-11 NOTE — Assessment & Plan Note (Signed)
With body aches/ chills and sweats that started abruptly yesterday in conj with ha and neck stiffness Fever down with tylenol at this time- but pt is toxic appearing  Worrisome for poss meningitis and he was sent to ER for further eval and poss LP

## 2012-05-11 NOTE — ED Notes (Signed)
UJW:[id number]<YN> Expected date:05/11/12<BR> Expected time: 1:46 PM<BR> Means of arrival:Ambulance<BR> Comments:<BR> 64yoF, Gen. Pain, depression

## 2012-05-11 NOTE — ED Notes (Signed)
Pt from home accompanied by wife with reports of headache, fever, generalized body aches and neck pain that started yesterday at 1700. Pt denies N/V/D.

## 2012-05-11 NOTE — Progress Notes (Signed)
Subjective:    Patient ID: Jacob Newton, male    DOB: 04/21/1986, 26 y.o.   MRN: [id number]  HPI Here sick  Fever - top 101.7 without tylenol / lower with tylenol  Since yesterday  Has symptoms of migraine (headaches for over a month) , also dizziness Larey Seat on the way here going down the stairs  Hurt his back worse than usual  Headache is severe now and describes it as all over and pounding  There is also neck stiffness    Cold sweats- yesterday Some nasal congestion  No sore throat  No ear pain  No n/v/d  No appetite last night Stomach hurt a bit last night also  No cough at all  Not short of breath   Patient Active Problem List  Diagnosis  . BACK PAIN, CHRONIC  . Smoker  . Routine general medical examination at a health care facility  . Poor concentration  . Fever  . Headache   Past Medical History  Diagnosis Date  . Back pain     chronic low back pain, with L%-S1 stenosis/bulge sisc/arthropathy  . Closed lumbar fx w/ cord inj 2010    hx lumbar fx  . Reactive airways dysfunction syndrome     with illnesses   No past surgical history on file. History  Substance Use Topics  . Smoking status: Current Everyday Smoker  . Smokeless tobacco: Not on file  . Alcohol Use: Yes     rarely on holidays   Family History  Problem Relation Age of Onset  . Hypertension Mother    Allergies  Allergen Reactions  . Chantix (Varenicline Tartrate) Other (See Comments)    Bad dreams  . Oxycodone-Acetaminophen     REACTION: N\T\V   Current Outpatient Prescriptions on File Prior to Visit  Medication Sig Dispense Refill  . cetirizine (ZYRTEC) 10 MG tablet Take 10 mg by mouth daily as needed.      Marland Kitchen HYDROcodone-acetaminophen (NORCO/VICODIN) 5-325 MG per tablet TAKE 1-2 TABLETS BY MOUTH UP TO 3 TIMES DAILY AS NEEDED FOR PAIN  180 tablet  0      Review of Systems    Review of Systems  Constitutional: Negative for unexpected wt change/pos for malaise/ fever/ body aches/  loss of appetite  Eyes: Negative for pain and visual disturbance.  ENTpos for nasal cong/ neg for ST Respiratory: Negative for cough and shortness of breath.   Cardiovascular: Negative for cp or palpitations    Gastrointestinal: Negative for nausea, diarrhea and constipation.  Genitourinary: Negative for urgency and frequency.  Skin: Negative for pallor or rash  neg for tick or other insect bite Neurological: Negative for focal weakness or speech problem, pos for ha and dizziness MSK pos for chronic back pain   Hematological: Negative for adenopathy. Does not bruise/bleed easily.  Psychiatric/Behavioral: Negative for dysphoric mood. The patient is not nervous/anxious.      Objective:   Physical Exam  Constitutional: He appears well-developed and well-nourished.       Sick appearing , quiet, dizzy/ unsteady  HENT:  Head: Normocephalic and atraumatic.  Right Ear: External ear normal.  Left Ear: External ear normal.  Mouth/Throat: Oropharynx is clear and moist. No oropharyngeal exudate.       Nares boggy Clear rhinorrhea  Eyes: Conjunctivae and EOM are normal. Pupils are equal, round, and reactive to light. Right eye exhibits no discharge. Left eye exhibits no discharge. No scleral icterus.  Neck: Muscular tenderness present. Decreased range of motion present.  Kernig's sign noted. No thyromegaly present.  Cardiovascular: Regular rhythm and normal heart sounds.   No murmur heard.      tachycardic  Pulmonary/Chest: Effort normal and breath sounds normal. No respiratory distress. He has no wheezes.       Diffusely distant bs   Abdominal: Soft. Bowel sounds are normal. He exhibits no distension. There is no tenderness.  Musculoskeletal: He exhibits no edema.  Lymphadenopathy:    He has no cervical adenopathy.  Neurological: He is alert. He displays normal reflexes. No cranial nerve deficit. Coordination abnormal.  Skin: Skin is warm and dry. No rash noted. No erythema. No pallor.    Psychiatric:       Quiet / responds to questions with one word answers           Assessment & Plan:

## 2012-05-11 NOTE — Assessment & Plan Note (Signed)
See eval for fever- also dizzy/ unsteady and toxic appearing Sent to [facility] ER for urgent eval - cannot r/o meningitis

## 2012-05-11 NOTE — ED Notes (Signed)
MD at bedside. 

## 2012-05-11 NOTE — ED Provider Notes (Signed)
History     26yM with general malaise. Onset yesterday. Fever, diffuse body aches, HA, neck stiffness. Per wife, very tired and weak but not confused. Dizzy and did fall earlier. Denies acute injury from this. No v/d. No sick contacts. No rash. No cough or SOB. No urinary complaints. Seen by PCP just prior to arrival and referred because of concern for possible meningitis. Pt reports hx of previous lumbar fx. Thinks L5. No surgery for this or neurosurgery for any other reason. Denies IV drug use. No numbness, tingling or focal loss of strength. Photophobia but no change in visual acuity.   CSN: [id number]  Arrival date & time 05/11/12  1355   First MD Initiated Contact with Patient 05/11/12 1544      Chief Complaint  Patient presents with  . Fever  . Headache  . Torticollis    (Consider location/radiation/quality/duration/timing/severity/associated sxs/prior treatment) HPI  Past Medical History  Diagnosis Date  . Back pain     chronic low back pain, with L%-S1 stenosis/bulge sisc/arthropathy  . Closed lumbar fx w/ cord inj 2010    hx lumbar fx  . Reactive airways dysfunction syndrome     with illnesses    Past Surgical History  Procedure Date  . Back surgery     Family History  Problem Relation Age of Onset  . Hypertension Mother     History  Substance Use Topics  . Smoking status: Current Everyday Smoker -- 2.0 packs/day    Types: Cigarettes  . Smokeless tobacco: Never Used  . Alcohol Use: Yes     rarely on holidays      Review of Systems   Review of symptoms negative unless otherwise noted in HPI.   Allergies  Chantix and Oxycodone-acetaminophen  Home Medications   Current Outpatient Rx  Name Route Sig Dispense Refill  . ACETAMINOPHEN 500 MG PO TABS Oral Take 500 mg by mouth every 6 (six) hours as needed. Fever.    Marland Kitchen CETIRIZINE HCL 10 MG PO TABS Oral Take 10 mg by mouth daily as needed. Allergies.    Marland Kitchen HYDROCODONE-ACETAMINOPHEN 5-325 MG PO TABS Oral  Take 1 tablet by mouth 3 (three) times daily as needed. Pain.      BP 130/79  Pulse 101  Temp 98.1 F (36.7 C) (Oral)  Resp 16  SpO2 100%  Physical Exam  Nursing note and vitals reviewed. Constitutional: He is oriented to person, place, and time.       Laying in bed with eyes closed. Ill appearing.  HENT:  Head: Normocephalic and atraumatic.  Eyes: EOM are normal. Pupils are equal, round, and reactive to light. Right eye exhibits no discharge. Left eye exhibits no discharge.  Neck:       Laying in bed with head still and slightly rotated to his L. C/o of increased pain with movement but no nuchal rigidity. Negative kernig's and brudzinski's signs.  Cardiovascular: Regular rhythm and normal heart sounds.  Exam reveals no gallop and no friction rub.   No murmur heard.      mildly tachycardic w/o murmur  Pulmonary/Chest: Effort normal and breath sounds normal. No respiratory distress. He has no wheezes. He has no rales. He exhibits no tenderness.  Abdominal: Soft. He exhibits no distension. There is no tenderness.  Musculoskeletal: He exhibits no edema and no tenderness.  Neurological: He is alert and oriented to person, place, and time. No cranial nerve deficit. He exhibits normal muscle tone. Coordination normal.  GCS 14. Lays with eyes closed but opens easily to voice. Speech is clear and is answering questions appropriately but answers short. Strength 5/5 b/l u/l extremities. Sensation intact to light touch.  Skin: Skin is warm and dry.       No concerning skin lesions noted.  Psychiatric: Thought content normal.    ED Course  LUMBAR PUNCTURE Date/Time: 05/11/2012 5:15 PM Performed by: Raeford Razor Authorized by: Raeford Razor Consent: Verbal consent obtained. Risks and benefits: risks, benefits and alternatives were discussed Consent given by: patient and spouse Required items: required blood products, implants, devices, and special equipment available Patient  identity confirmed: verbally with patient, arm band and provided demographic data Indications: evaluation for infection Local anesthetic: lidocaine 1% without epinephrine Anesthetic total: 2 ml Patient sedated: no Preparation: Patient was prepped and draped in the usual sterile fashion. Lumbar space: L3-L4 interspace Patient's position: left lateral decubitus Needle gauge: 22 Needle type: spinal needle - Quincke tip Needle length: 3.5 in Number of attempts: 1 Fluid appearance: blood-tinged then clearing Tubes of fluid: 4 Total volume: 6 ml Post-procedure: adhesive bandage applied Patient tolerance: Patient tolerated the procedure well with no immediate complications.   (including critical care time)  Labs Reviewed  CBC WITH DIFFERENTIAL - Abnormal; Notable for the following:    MCHC 36.8 (*)     Lymphocytes Relative 49 (*)     All other components within normal limits  BASIC METABOLIC PANEL - Abnormal; Notable for the following:    Potassium 3.4 (*)     All other components within normal limits  CSF CELL COUNT WITH DIFFERENTIAL - Abnormal; Notable for the following:    Color, CSF PINK (*)     Appearance, CSF HAZY (*)     RBC Count, CSF 780 (*)     All other components within normal limits  PROTEIN, CSF - Abnormal; Notable for the following:    Total  Protein, CSF 71 (*)     All other components within normal limits  GRAM STAIN  GLUCOSE, CSF  CULTURE, BLOOD (ROUTINE X 2)  CULTURE, BLOOD (ROUTINE X 2)  CSF CULTURE  VDRL, CSF   No results found.   1. Fever   2. Malaise and fatigue       MDM  26yM with fever and neck stiffness. Ill appearing on exam and agree that symptoms concerning for possible meningitis. Do not have reasonable alternative etiology of symptoms based on hx or exam at this point. Possible viral illness.  Pt with remote hx of lumbar fx, but no contraindication for LP. Nonfocal neuro exam and no reported seizure activity so no indication for neuro  imaging at this time. Empiric abx as well as w/u for other potential etiologies. Steroids if LP consistent with meningitis. IVF, Pain meds.   7:50 PM LP with elevated with protein, otherwise not consistent with bacterial meningitis. HD stable and reports improved with symptomatic tx in ED. Suspect viral illness, possibly viral meningitis. Doubt septic. Feel pt is safe for DC at this time. Strict return precautions discussed with pt and wife.      Raeford Razor, MD 05/11/12 [phone]

## 2012-05-12 ENCOUNTER — Telehealth: Payer: Self-pay

## 2012-05-12 NOTE — Telephone Encounter (Signed)
Agreed, the tachycardia is still worrisome for dehydration

## 2012-05-12 NOTE — Telephone Encounter (Signed)
pts wife said pt has extreme h/a with dizziness and unstable walking; pain level now 9. Pt also has neck and back pain with pain in rt arm also; hurts worse upon movement. Pt feels like he cannot move neck. No chest pain,SOB or fever now. Pt feels like his heart is still racing. Dr Milinda Antis advised needs to go to [facility] ER for eval now. Pts wife understood and will take pt to ER now.

## 2012-05-13 ENCOUNTER — Encounter: Payer: Self-pay | Admitting: Family Medicine

## 2012-05-13 ENCOUNTER — Ambulatory Visit (INDEPENDENT_AMBULATORY_CARE_PROVIDER_SITE_OTHER): Payer: BC Managed Care – PPO | Admitting: Family Medicine

## 2012-05-13 VITALS — BP 134/70 | HR 92 | Temp 98.3°F | Wt 150.8 lb

## 2012-05-13 DIAGNOSIS — R509 Fever, unspecified: Secondary | ICD-10-CM

## 2012-05-13 DIAGNOSIS — M79609 Pain in unspecified limb: Secondary | ICD-10-CM

## 2012-05-13 DIAGNOSIS — R51 Headache: Secondary | ICD-10-CM

## 2012-05-13 DIAGNOSIS — M79601 Pain in right arm: Secondary | ICD-10-CM

## 2012-05-13 MED ORDER — PREDNISONE 20 MG PO TABS: ORAL_TABLET | ORAL | Status: DC

## 2012-05-13 MED ORDER — AMITRIPTYLINE HCL 50 MG PO TABS: 50.0000 mg | ORAL_TABLET | Freq: Every day | ORAL | Status: DC

## 2012-05-13 NOTE — Patient Instructions (Addendum)
I'm sorry you're not feeling well. For neck pain and shooting pain down arm, I want to treat you with course of steroids = stop voltaren (diclofenac) while you're on this. For headaches, start medicine called amitriptyline daily to help prevent headaches from coming on. If fever returns, or any worsening, please return to see [place]. Otherwise, please return to see Dr. Milinda Antis for follow up in 3-4 weeks. Continue flexeril for muscle spasm and for headache as well. If any weakness of right hand/arm, please seek urgent care.

## 2012-05-13 NOTE — Assessment & Plan Note (Signed)
Reviewed records from recent ER visit and Dr. Royden Purl previous note.  Awaiting records from [facility]. Not fully consistent with migraines, ?TTH as start at base of neck, strongly associated with muscle tightness. Recommended continued flexeril. However, given endorsing longstanding h/o migraines as well as 3 + mo h/o recently worsening, discussed prophylactic headache medication - start amitriptyline at bedtime, f/u with PCP in 3-4 wks, return sooner if not improving as expected. Emphasized importance of f/u sooner if worsening.

## 2012-05-13 NOTE — Progress Notes (Addendum)
  Subjective:    Patient ID: Jacob Newton, male    DOB: 1986-09-06, 26 y.o.   MRN: [id number]  HPI CC: HA  26 yo pt of Dr. Melvia Heaps with somewhat complicated recent history presents for f/u HA.  Difficult to get history out of him.  Seen by PCP 05/11/2012 with fever to 101.7, neck stiffness and headache, referred to ER for LP given concern for meningitis - tap with elevated protein but no other evidence of bacterial cause.  Records reviewed.  CSF Culture - NGTD. CBC with WBC 5.4.  Then seen by ortho (Dr. Cleophas Dunker) at [facility], had 2 xrays - told mild DDD - and started on flexeril and voltaren.  These meds have eased muscle spasms but still continued pain.  On vicodin since 2009 since back injury, longstanding.  vicodin does not help current headache.  HA has continued, now with shooting pain down right arm as well as numbness down arm.  Medial shooting pain down arm.  Continued neck pain and stiffness.  HA starts at posterior base of neck and then radiates to entire head, throbbing pain bilaterally.  + photo/phonophobia.  No nausea.  No aura prior.  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  Has had bad migraines for past 3 months.  Recently worsening frequency - to every other day.  Would take BC powders to control.  Monday night to point of dizziness causing him to fall down stairs.  Dizziness improving.  No more fevers/chills.  No vision changes,  Chest pain, SOB.  Past Medical History  Diagnosis Date  . Back pain     chronic low back pain, with L%-S1 stenosis/bulge sisc/arthropathy  . Closed lumbar fx w/ cord inj 2010    hx lumbar fx  . Reactive airways dysfunction syndrome     with illnesses    Review of Systems Per HPI    Objective:   Physical Exam  Nursing note and vitals reviewed. Constitutional: He appears well-developed and well-nourished. No distress.       Wearing sunglasses  HENT:  Head: Normocephalic and  atraumatic.  Mouth/Throat: Oropharynx is clear and moist.  Eyes: Conjunctivae and EOM are normal. Pupils are equal, round, and reactive to light. No scleral icterus.  Neck: Normal range of motion. Neck supple.       Somewhat stiff with movement but full ROM  Musculoskeletal:       Mild generalized discomfort with palpation of midline cervical spine as well as splenius muscles bilaterally. Tender at occiput. Tender with palpation at right medial arm  Lymphadenopathy:    He has no cervical adenopathy.  Neurological: He has normal strength. A sensory deficit (mild right arm numbness) is present. No cranial nerve deficit. He exhibits normal muscle tone. He displays a negative Romberg sign. Coordination and gait normal.  Reflex Scores:      Bicep reflexes are 2+ on the right side and 2+ on the left side.      Brachioradialis reflexes are 2+ on the right side and 2+ on the left side.      CN 2-12 intact Slowed but bilaterally equal FTN Trouble with HTS 2/2 chronic lower back pain Station and gait intact  Psychiatric:       Strange affect, limited responses, difficult to get him to further describe sxs       Assessment & Plan:

## 2012-05-13 NOTE — Assessment & Plan Note (Signed)
Currently resolved. °

## 2012-05-13 NOTE — Assessment & Plan Note (Addendum)
New, associated with some right sided numbness.  anticipate cervical radiculopathy picture.   No weakness on exam today, neurological exam otherwise nonfocal. Treat with short course of prednisone. See above for other treatment plan. if not improving, low threshold to refer back to ortho/spine or get MRI of cervical spine.  Denies inciting injury/trauma.

## 2012-05-15 LAB — CSF CULTURE W GRAM STAIN: Gram Stain: NONE SEEN

## 2012-05-15 LAB — CSF CULTURE: Culture: NO GROWTH

## 2012-05-17 ENCOUNTER — Other Ambulatory Visit: Payer: Self-pay | Admitting: Family Medicine

## 2012-05-17 LAB — CULTURE, BLOOD (ROUTINE X 2)
Culture: NO GROWTH
Culture: NO GROWTH

## 2012-05-17 LAB — VDRL, CSF: VDRL Quant, CSF: NONREACTIVE

## 2012-05-17 NOTE — Telephone Encounter (Signed)
Request for Hydrocodone-Acetaminophen 5-325 MG ok to refill?

## 2012-05-17 NOTE — Telephone Encounter (Signed)
Too early- was just refilled 9/3 according to the record

## 2012-05-18 MED ORDER — HYDROCODONE-ACETAMINOPHEN 5-325 MG PO TABS: 1.0000 | ORAL_TABLET | Freq: Three times a day (TID) | ORAL | Status: DC | PRN

## 2012-05-18 NOTE — Addendum Note (Signed)
Addended by: Roxy Manns A on: 05/18/2012 12:54 PM   Modules accepted: Orders

## 2012-05-18 NOTE — Telephone Encounter (Signed)
Pt called; pt was seen in [facility] ER on 05/11/12; ER updated med list historically with Hydrocodone and did not give rx. April at [facility] said last refill Hydrocodone-APAP was 04/19/12 for #180. Pt request call back with Dr Royden Purl decision about refill.Please advise.

## 2012-05-18 NOTE — Telephone Encounter (Signed)
Thank you - we can go ahead and refil then Px written for call in

## 2012-05-18 NOTE — Telephone Encounter (Signed)
Rx Hydrocodone5-325 called in to [facility]

## 2012-05-18 NOTE — Addendum Note (Signed)
Addended by: Patience Musca on: 05/18/2012 11:02 AM   Modules accepted: Orders

## 2012-06-14 ENCOUNTER — Telehealth: Payer: Self-pay

## 2012-06-14 MED ORDER — HYDROCODONE-ACETAMINOPHEN 5-325 MG PO TABS: 1.0000 | ORAL_TABLET | Freq: Three times a day (TID) | ORAL | Status: DC | PRN

## 2012-06-14 NOTE — Telephone Encounter (Signed)
Px written for call in  Have him make appt with me when he returns from his trip (any 30 min you can put together)- we will review his ADHD assessment and symptoms and make a plan

## 2012-06-14 NOTE — Telephone Encounter (Signed)
Called in Rx as prescribed, notified pt and advise pt to make f/u appt to review his ADHD assessment when he gets back from trip

## 2012-06-14 NOTE — Telephone Encounter (Signed)
Pt brought ADHD assessment to Dr Milinda Antis; pt leaving for 3-4 weeks for [facility] tomorrow. Pt request call back with further instructions. Pt said not able to concentrate so pt has issues with memory. Pt also request Hydrocodone apap called to [facility].Please advise. ADHD assessment on Dr Royden Purl shelf.

## 2012-06-28 ENCOUNTER — Encounter: Payer: Self-pay | Admitting: Family Medicine

## 2012-06-28 ENCOUNTER — Ambulatory Visit (INDEPENDENT_AMBULATORY_CARE_PROVIDER_SITE_OTHER): Payer: BC Managed Care – PPO | Admitting: Family Medicine

## 2012-06-28 VITALS — BP 138/76 | HR 84 | Temp 97.6°F | Ht 65.5 in | Wt 159.0 lb

## 2012-06-28 DIAGNOSIS — R41840 Attention and concentration deficit: Secondary | ICD-10-CM

## 2012-06-28 DIAGNOSIS — F988 Other specified behavioral and emotional disorders with onset usually occurring in childhood and adolescence: Secondary | ICD-10-CM | POA: Insufficient documentation

## 2012-06-28 MED ORDER — BUPROPION HCL ER (XL) 150 MG PO TB24: 150.0000 mg | ORAL_TABLET | Freq: Every day | ORAL | Status: DC

## 2012-06-28 NOTE — Progress Notes (Signed)
Subjective:    Patient ID: Jacob Newton, male    DOB: [birth year], 26 y.o.   MRN: [id number]  HPI Here to discuss results of pt's ADHD testing  He went for testing in [place]  Part of it was on the computer - could not concentrate on the computer  Did assays for memory and reaction time  Would assign him to different tasks - and have him go between  Talked to the administrator  Talked about what is going on at home and what is going on with his job  She talked to him about memory issues -- and how that affects his performance with concentration  His lowest scores were in verbal and visual memory, psychomotor speed and processing speed  Trouble concentrating is his biggest problem    Last visit disc lifelong problem with inattentiveness and problems with concentration along with poor grades in school Also inability to finish tasks  fam hx   Is on narcotic pain med He does take it every day  Has never talked to anyone about getting off of it  Does not have time to see a specialist here due to his schedule of working out of town   Quit his  job Friday- in [place] -- was not "paying him right"-- working a lot of hours , got pay check but would not pay for lodging while he was there  Was a Fish farm manager for [employer]  Going back- they begged him to go back - he is going back today  Works there 4 weeks out and 5 days in  Does not want to move there  He makes more money there- does not want to work here   Is thinking about quitting smoking -- has changed over to the E cig   Patient Active Problem List  Diagnosis  . BACK PAIN, CHRONIC  . Smoker  . Routine general medical examination at a health care facility  . Poor concentration  . Fever  . Headache  . Right arm pain   Past Medical History  Diagnosis Date  . Back pain     chronic low back pain, with L%-S1 stenosis/bulge sisc/arthropathy  . Closed lumbar fx w/ cord inj 2010    hx lumbar fx  . Reactive airways  dysfunction syndrome     with illnesses   Past Surgical History  Procedure Date  . Back surgery    History  Substance Use Topics  . Smoking status: Current Every Day Smoker -- 0.5 packs/day    Types: Cigarettes  . Smokeless tobacco: Never Used  . Alcohol Use: Yes     rarely on holidays   Family History  Problem Relation Age of Onset  . Hypertension Mother    Allergies  Allergen Reactions  . Chantix (Varenicline Tartrate) Other (See Comments)    Bad dreams  . Oxycodone-Acetaminophen     REACTION: N\T\V, itching   Current Outpatient Prescriptions on File Prior to Visit  Medication Sig Dispense Refill  . acetaminophen (TYLENOL) 500 MG tablet Take 500 mg by mouth every 6 (six) hours as needed. Fever.      Marland Kitchen amitriptyline (ELAVIL) 50 MG tablet Take 1 tablet (50 mg total) by mouth at bedtime.  30 tablet  3  . cetirizine (ZYRTEC) 10 MG tablet Take 10 mg by mouth daily as needed. Allergies.      . cyclobenzaprine (FLEXERIL) 10 MG tablet Take 10 mg by mouth 3 (three) times daily as needed.      Marland Kitchen  diclofenac (VOLTAREN) 75 MG EC tablet Take 75 mg by mouth 2 (two) times daily.      Marland Kitchen HYDROcodone-acetaminophen (NORCO/VICODIN) 5-325 MG per tablet Take 1-2 tablets by mouth 3 (three) times daily as needed. Pain.  180 tablet  0          Review of Systems Review of Systems  Constitutional: Negative for fever, appetite change, fatigue and unexpected weight change.  Eyes: Negative for pain and visual disturbance.  Respiratory: Negative for cough and shortness of breath.   Cardiovascular: Negative for cp or palpitations    Gastrointestinal: Negative for nausea, diarrhea and constipation.  Genitourinary: Negative for urgency and frequency.  Skin: Negative for pallor or rash   Neurological: Negative for weakness, light-headedness, numbness and headaches.  Hematological: Negative for adenopathy. Does not bruise/bleed easily.  Psychiatric/Behavioral: Negative for dysphoric mood. The  patient is not nervous/anxious.  pos for trouble concentrating        Objective:   Physical Exam  Constitutional: He is oriented to person, place, and time. He appears well-developed and well-nourished. No distress.  HENT:  Head: Normocephalic and atraumatic.  Eyes: Conjunctivae normal and EOM are normal. Pupils are equal, round, and reactive to light.  Neck: Normal range of motion. Neck supple. No thyromegaly present.  Cardiovascular: Normal rate and regular rhythm.   Pulmonary/Chest: Effort normal and breath sounds normal. No respiratory distress. He has no wheezes.  Musculoskeletal: He exhibits no edema.  Neurological: He is alert and oriented to person, place, and time. He has normal reflexes. He displays no tremor. No cranial nerve deficit. He exhibits normal muscle tone. Coordination normal.  Skin: Skin is warm and dry. No rash noted. No erythema. No pallor.  Psychiatric: He has a normal mood and affect.       Affect is baseline somewhat blunted Answers questions shortly but appropriately           Assessment & Plan:

## 2012-06-28 NOTE — Patient Instructions (Addendum)
Start wellbutrin xl 150 mg one pill daily to help attention and concentration issues If any side effects, let me know  Follow up with me in 2-3 months  I sent px to [facility]

## 2012-06-28 NOTE — Assessment & Plan Note (Signed)
Reviewed his testing in detail No s/s of depression or anxiety - though his affect seems blunt today (baseline) Tested lowest on memory and complex attn areas  Will try on wellbutrin xl 150 daily- disc poss side eff  Not a candidate for stimulant given he is already on a narcotic pain med chronically- he voiced understanding Will f/u 2-3 mo  If not imp will consider strattera

## 2012-06-30 ENCOUNTER — Telehealth: Payer: Self-pay | Admitting: Family Medicine

## 2012-06-30 NOTE — Telephone Encounter (Signed)
FYI to Dr Milinda Antis, regarding where this patient had his ADHD assessment done. Patient called the office to let Dr Milinda Antis know that he had it done at [facility] on [address], he couldn't remember who he saw only that it was a woman. I left messages for Margret Chance that you wanted to get a copy of the office note if there was one.

## 2012-07-13 ENCOUNTER — Other Ambulatory Visit: Payer: Self-pay | Admitting: Family Medicine

## 2012-07-13 NOTE — Telephone Encounter (Signed)
Rx called in as prescribed 

## 2012-07-13 NOTE — Telephone Encounter (Signed)
Px written for call in   

## 2012-07-13 NOTE — Telephone Encounter (Signed)
Ok to refill 

## 2012-08-10 ENCOUNTER — Other Ambulatory Visit: Payer: Self-pay | Admitting: Family Medicine

## 2012-08-10 NOTE — Telephone Encounter (Signed)
Ok to refill 

## 2012-08-10 NOTE — Telephone Encounter (Signed)
Rx called in as prescribed 

## 2012-08-10 NOTE — Telephone Encounter (Signed)
Px written for call in   

## 2012-09-09 ENCOUNTER — Other Ambulatory Visit: Payer: Self-pay | Admitting: Family Medicine

## 2012-09-09 NOTE — Telephone Encounter (Signed)
Ok to refill 

## 2012-09-09 NOTE — Telephone Encounter (Signed)
He can have 6 mo of refils , thanks

## 2012-09-09 NOTE — Telephone Encounter (Signed)
Px written for call in   

## 2012-09-09 NOTE — Telephone Encounter (Signed)
Rx called in as prescribed 

## 2012-10-06 ENCOUNTER — Other Ambulatory Visit: Payer: Self-pay | Admitting: Family Medicine

## 2012-10-06 NOTE — Telephone Encounter (Signed)
Px written for call in   

## 2012-10-06 NOTE — Telephone Encounter (Signed)
Rx called in as prescribed 

## 2012-10-06 NOTE — Telephone Encounter (Signed)
Ok to refill 

## 2012-11-04 ENCOUNTER — Other Ambulatory Visit: Payer: Self-pay | Admitting: Family Medicine

## 2012-11-04 NOTE — Telephone Encounter (Signed)
Px written for call in   

## 2012-11-04 NOTE — Telephone Encounter (Signed)
Ok to refill 

## 2012-11-04 NOTE — Telephone Encounter (Signed)
Rx called in as prescribed 

## 2012-12-02 ENCOUNTER — Other Ambulatory Visit: Payer: Self-pay | Admitting: Family Medicine

## 2012-12-02 NOTE — Telephone Encounter (Signed)
Px written for call in   

## 2012-12-02 NOTE — Telephone Encounter (Signed)
Ok to refill 

## 2012-12-02 NOTE — Telephone Encounter (Signed)
Rx called in as prescribed 

## 2012-12-30 ENCOUNTER — Other Ambulatory Visit: Payer: Self-pay | Admitting: Family Medicine

## 2012-12-30 NOTE — Telephone Encounter (Signed)
Rx called in as prescribed 

## 2012-12-30 NOTE — Telephone Encounter (Signed)
Ok to refill 

## 2012-12-30 NOTE — Telephone Encounter (Signed)
Px written for call in   

## 2013-01-26 ENCOUNTER — Other Ambulatory Visit: Payer: Self-pay | Admitting: Family Medicine

## 2013-01-26 NOTE — Telephone Encounter (Signed)
Px written for call in   

## 2013-01-26 NOTE — Telephone Encounter (Signed)
Electronic refill request, ok to refill? 

## 2013-01-26 NOTE — Telephone Encounter (Signed)
Rx called in as prescribed 

## 2013-02-23 ENCOUNTER — Other Ambulatory Visit: Payer: Self-pay | Admitting: Family Medicine

## 2013-02-23 NOTE — Telephone Encounter (Signed)
Px written for call in   

## 2013-02-23 NOTE — Telephone Encounter (Signed)
Rx called in as prescribed 

## 2013-02-23 NOTE — Telephone Encounter (Signed)
Electronic refill request, please advise  

## 2013-03-09 ENCOUNTER — Ambulatory Visit: Payer: Self-pay

## 2013-03-09 ENCOUNTER — Ambulatory Visit (INDEPENDENT_AMBULATORY_CARE_PROVIDER_SITE_OTHER): Payer: BC Managed Care – PPO | Admitting: *Deleted

## 2013-03-09 DIAGNOSIS — W268XXA Contact with other sharp object(s), not elsewhere classified, initial encounter: Secondary | ICD-10-CM

## 2013-03-09 DIAGNOSIS — Z23 Encounter for immunization: Secondary | ICD-10-CM

## 2013-03-09 DIAGNOSIS — W450XXA Nail entering through skin, initial encounter: Secondary | ICD-10-CM

## 2013-03-23 ENCOUNTER — Other Ambulatory Visit: Payer: Self-pay | Admitting: Family Medicine

## 2013-03-23 NOTE — Telephone Encounter (Signed)
Electronic refill request, please advise  

## 2013-03-23 NOTE — Telephone Encounter (Signed)
Rx called in as prescribed 

## 2013-03-23 NOTE — Telephone Encounter (Signed)
Px written for call in   

## 2013-04-20 ENCOUNTER — Other Ambulatory Visit: Payer: Self-pay | Admitting: Family Medicine

## 2013-04-20 NOTE — Telephone Encounter (Signed)
Electronic refill request, please advise  

## 2013-04-20 NOTE — Telephone Encounter (Signed)
Px written for call in   

## 2013-04-21 NOTE — Telephone Encounter (Signed)
Rx called in as prescribed 

## 2013-05-18 ENCOUNTER — Other Ambulatory Visit: Payer: Self-pay | Admitting: Family Medicine

## 2013-05-18 NOTE — Telephone Encounter (Signed)
Electronic refill request, please advise  

## 2013-05-18 NOTE — Telephone Encounter (Signed)
Px written for call in   

## 2013-05-19 NOTE — Telephone Encounter (Signed)
Rx called in as prescribed 

## 2013-06-15 ENCOUNTER — Other Ambulatory Visit: Payer: Self-pay

## 2013-06-15 ENCOUNTER — Encounter: Payer: Self-pay | Admitting: Radiology

## 2013-06-15 MED ORDER — HYDROCODONE-ACETAMINOPHEN 5-325 MG PO TABS: ORAL_TABLET | ORAL | Status: DC

## 2013-06-15 MED ORDER — CYCLOBENZAPRINE HCL 10 MG PO TABS: 10.0000 mg | ORAL_TABLET | Freq: Three times a day (TID) | ORAL | Status: DC | PRN

## 2013-06-15 NOTE — Telephone Encounter (Signed)
The elavil should not be due yet? -had 30 with 5 refills three mo ago  Px written for the rest - since we can no longer call in the narcotic

## 2013-06-15 NOTE — Telephone Encounter (Signed)
Pt advise Rx ready for pick-up, I advise pt he will need to update controlled sub agreement. [facility] and pt did have 3 additional refills left of elavil so I advised pt of this

## 2013-06-15 NOTE — Telephone Encounter (Signed)
Pt request refills on amitriptyline, cyclobenzaprine to [facility] and written rx for hydrocodone apap. Call when ready for pick up. Pt needs to pick up by 06/17/13.

## 2013-07-01 ENCOUNTER — Encounter: Payer: Self-pay | Admitting: Family Medicine

## 2013-07-13 ENCOUNTER — Other Ambulatory Visit: Payer: Self-pay | Admitting: Family Medicine

## 2013-07-13 NOTE — Telephone Encounter (Signed)
Pain med printed for pick up  I sent flexeril to the pharmacy

## 2013-07-13 NOTE — Telephone Encounter (Signed)
Pt.notified

## 2013-07-13 NOTE — Telephone Encounter (Signed)
Electronic refill request, please advise  

## 2013-08-10 ENCOUNTER — Other Ambulatory Visit: Payer: Self-pay

## 2013-08-10 MED ORDER — HYDROCODONE-ACETAMINOPHEN 5-325 MG PO TABS: ORAL_TABLET | ORAL | Status: DC

## 2013-08-10 NOTE — Telephone Encounter (Signed)
I think he is due for a yearly follow up  Schedule this winter please Px printed for pick up in IN box

## 2013-08-10 NOTE — Telephone Encounter (Signed)
Pt request rx hydrocodone apap. Call when ready for pick up. 

## 2013-08-11 NOTE — Telephone Encounter (Signed)
appt scheduled and pt advised Rx ready for pick-up

## 2013-09-05 ENCOUNTER — Encounter: Payer: Self-pay | Admitting: Family Medicine

## 2013-09-05 ENCOUNTER — Ambulatory Visit (INDEPENDENT_AMBULATORY_CARE_PROVIDER_SITE_OTHER): Payer: Self-pay | Admitting: Family Medicine

## 2013-09-05 VITALS — BP 116/78 | HR 86 | Temp 98.4°F | Ht 65.5 in | Wt 158.8 lb

## 2013-09-05 DIAGNOSIS — T50905D Adverse effect of unspecified drugs, medicaments and biological substances, subsequent encounter: Secondary | ICD-10-CM

## 2013-09-05 DIAGNOSIS — T50905A Adverse effect of unspecified drugs, medicaments and biological substances, initial encounter: Secondary | ICD-10-CM | POA: Insufficient documentation

## 2013-09-05 DIAGNOSIS — Z5189 Encounter for other specified aftercare: Secondary | ICD-10-CM

## 2013-09-05 DIAGNOSIS — M549 Dorsalgia, unspecified: Secondary | ICD-10-CM

## 2013-09-05 DIAGNOSIS — F17200 Nicotine dependence, unspecified, uncomplicated: Secondary | ICD-10-CM

## 2013-09-05 LAB — HEPATIC FUNCTION PANEL
Albumin: 4.7 g/dL (ref 3.5–5.2)
Alkaline Phosphatase: 69 U/L (ref 39–117)
Total Bilirubin: 1.7 mg/dL — ABNORMAL HIGH (ref 0.3–1.2)
Total Protein: 7.5 g/dL (ref 6.0–8.3)

## 2013-09-05 NOTE — Assessment & Plan Note (Signed)
With hx of closed lumbar fx/ cord injury Continues to use 180 norco per mo low dose - this seems to handle his pain and enable him to work  He is compliant  No insurance currently

## 2013-09-05 NOTE — Progress Notes (Signed)
Pre-visit discussion using our clinic review tool. No additional management support is needed unless otherwise documented below in the visit note.  

## 2013-09-05 NOTE — Assessment & Plan Note (Signed)
Disc in detail risks of smoking and possible outcomes including copd, vascular/ heart disease, cancer , respiratory and sinus infections  Pt voices understanding Pt has cut down and started using a vapor cigarette some of the time No resp symptoms  Cannot afford a flu shot w/o insurance - but adv to get it at the health dept

## 2013-09-05 NOTE — Patient Instructions (Signed)
Labs today to check liver function  Think about getting a flu vaccine at the health dept.  Keep working on quitting smoking

## 2013-09-05 NOTE — Progress Notes (Signed)
Subjective:    Patient ID: Jacob Newton, male    DOB: 13-Nov-1985, 27 y.o.   MRN: [id number]  HPI Here for f/u of chronic health problems    Has been working - had a good holiday also  Very busy- has kids and a full time job   Pt has hx of chronic back pain and takes norco 5-325 regularly Back is no better and no worse - but neck is not as good (? If his disc disease ext to his neck)  No new numbness or weakness  Schedule for taking norco - takes 2 pills at most every 4 hours  Handles pain enough to get through the day Not a candidate for another surgery No alcohol    Flu vaccine - has not had (whole family has)- but he has not due to lack of ins   Mood-has been good / stays motivated and no depression or anxiety problems   Wt is stable with bmi of 26  Still smoking  Down to 1/2 ppd per day  He has cut down - he intends to quit Has tried e cig and that works well for him   Patient Active Problem List   Diagnosis Date Noted  . ADD (attention deficit disorder) 06/28/2012  . Poor concentration 02/23/2012  . Routine general medical examination at a health care facility 02/12/2012  . Smoker 08/22/2011  . BACK PAIN, CHRONIC 02/07/2010   Past Medical History  Diagnosis Date  . Back pain     chronic low back pain, with L%-S1 stenosis/bulge sisc/arthropathy  . Closed lumbar fx w/ cord inj 2010    hx lumbar fx  . Reactive airways dysfunction syndrome     with illnesses   Past Surgical History  Procedure Laterality Date  . Back surgery     History  Substance Use Topics  . Smoking status: Current Every Day Smoker -- 0.50 packs/day    Types: Cigarettes  . Smokeless tobacco: Never Used  . Alcohol Use: Yes     Comment: rarely on holidays   Family History  Problem Relation Age of Onset  . Hypertension Mother    Allergies  Allergen Reactions  . Chantix [Varenicline Tartrate] Other (See Comments)    Bad dreams  . Oxycodone-Acetaminophen     REACTION: N\T\V,  itching   Current Outpatient Prescriptions on File Prior to Visit  Medication Sig Dispense Refill  . cetirizine (ZYRTEC) 10 MG tablet Take 10 mg by mouth daily as needed. Allergies.      . cyclobenzaprine (FLEXERIL) 10 MG tablet TAKE ONE TABLET BY MOUTH 3 TIMES DAILY AS NEEDED.  30 tablet  3  . HYDROcodone-acetaminophen (NORCO/VICODIN) 5-325 MG per tablet TAKE 1 TO 2 TABLETS BY MOUTH 3 TIMES A DAY AS NEEDED AS DIRECTED.  180 tablet  0   No current facility-administered medications on file prior to visit.     Review of Systems Review of Systems  Constitutional: Negative for fever, appetite change, fatigue and unexpected weight change.  Eyes: Negative for pain and visual disturbance.  Respiratory: Negative for cough and shortness of breath.   Cardiovascular: Negative for cp or palpitations    Gastrointestinal: Negative for nausea, diarrhea and constipation.  Genitourinary: Negative for urgency and frequency.  Skin: Negative for pallor or rash  MSK pos for chronic back and neck pain / neg for joint swelling   Neurological: Negative for weakness, light-headedness, numbness and headaches.  Hematological: Negative for adenopathy. Does not bruise/bleed easily.  Psychiatric/Behavioral: Negative for dysphoric mood. The patient is not nervous/anxious.         Objective:   Physical Exam  Constitutional: He appears well-developed and well-nourished. No distress.  HENT:  Head: Normocephalic and atraumatic.  Mouth/Throat: Oropharynx is clear and moist.  Eyes: Conjunctivae and EOM are normal. Pupils are equal, round, and reactive to light. Right eye exhibits no discharge. Left eye exhibits no discharge. No scleral icterus.  Neck: Normal range of motion. Neck supple. No JVD present. Carotid bruit is not present. No thyromegaly present.  Cardiovascular: Normal rate, regular rhythm, normal heart sounds and intact distal pulses.  Exam reveals no gallop.   Pulmonary/Chest: Effort normal and breath  sounds normal. No respiratory distress. He has no wheezes. He exhibits no tenderness.  Musculoskeletal: He exhibits tenderness. He exhibits no edema.  Tenderness over upper lumbar spine Neg SLR Nl gait  Limited flexion  Lymphadenopathy:    He has no cervical adenopathy.  Neurological: He is alert. He has normal reflexes. No cranial nerve deficit. He exhibits normal muscle tone. Coordination normal.  Skin: Skin is warm and dry. No rash noted. No pallor.  Psychiatric: He has a normal mood and affect.          Assessment & Plan:

## 2013-09-05 NOTE — Assessment & Plan Note (Signed)
Pt takes norco every 4-6 hours  LFTs today  No problems

## 2013-09-07 ENCOUNTER — Other Ambulatory Visit: Payer: Self-pay

## 2013-09-07 MED ORDER — HYDROCODONE-ACETAMINOPHEN 5-325 MG PO TABS: ORAL_TABLET | ORAL | Status: DC

## 2013-09-07 NOTE — Telephone Encounter (Signed)
Pt request rx hydrocodone apap. Call when ready for pick up . Pt is out of medication. 

## 2013-09-07 NOTE — Telephone Encounter (Signed)
Pt notified Rx ready for pickup 

## 2013-09-07 NOTE — Telephone Encounter (Signed)
Px printed for pick up in IN box  

## 2013-10-05 ENCOUNTER — Other Ambulatory Visit: Payer: Self-pay | Admitting: Family Medicine

## 2013-10-05 MED ORDER — HYDROCODONE-ACETAMINOPHEN 5-325 MG PO TABS: ORAL_TABLET | ORAL | Status: DC

## 2013-10-05 MED ORDER — CYCLOBENZAPRINE HCL 10 MG PO TABS: ORAL_TABLET | ORAL | Status: DC

## 2013-10-05 NOTE — Telephone Encounter (Signed)
Flexeril sent in  Hydrocodone will be printed for pick up  thanks

## 2013-10-05 NOTE — Telephone Encounter (Signed)
Flexeril last filled 07/13/13. Hydrocodone last filled 09/07/13.

## 2013-10-05 NOTE — Telephone Encounter (Signed)
Spoke to pt and informed him Rx is available at the front desk for pickup. Pt informed gov't issued photo id required for pickup

## 2013-11-02 ENCOUNTER — Other Ambulatory Visit: Payer: Self-pay

## 2013-11-02 MED ORDER — HYDROCODONE-ACETAMINOPHEN 5-325 MG PO TABS: ORAL_TABLET | ORAL | Status: DC

## 2013-11-02 NOTE — Telephone Encounter (Signed)
Pt notified Rx ready for pickup 

## 2013-11-02 NOTE — Telephone Encounter (Signed)
Pt left v/m requesting rx hydrocodone apap. Call when ready for pick up.  

## 2013-11-02 NOTE — Telephone Encounter (Signed)
Px printed for pick up in IN box  

## 2013-11-28 ENCOUNTER — Other Ambulatory Visit: Payer: Self-pay

## 2013-11-28 NOTE — Telephone Encounter (Signed)
Pt left v/m requesting refill hydrocodone apap. Call when ready for pick up. Dr Milinda Antisower out of office.

## 2013-11-29 MED ORDER — HYDROCODONE-ACETAMINOPHEN 5-325 MG PO TABS: ORAL_TABLET | ORAL | Status: DC

## 2013-11-29 NOTE — Telephone Encounter (Signed)
Px printed for pick up in IN box  

## 2013-11-29 NOTE — Telephone Encounter (Signed)
To PCP

## 2013-11-29 NOTE — Telephone Encounter (Signed)
Pt notified Rx ready for pickup 

## 2013-12-27 ENCOUNTER — Other Ambulatory Visit: Payer: Self-pay

## 2013-12-27 MED ORDER — HYDROCODONE-ACETAMINOPHEN 5-325 MG PO TABS: ORAL_TABLET | ORAL | Status: DC

## 2013-12-27 NOTE — Telephone Encounter (Signed)
Pt left v/m requesting rx hydrocodone apap. Call when ready for pick up.  

## 2013-12-27 NOTE — Telephone Encounter (Signed)
Left voicemail letting pt know Rx ready for pick up 

## 2013-12-27 NOTE — Telephone Encounter (Signed)
Px printed for pick up in IN box  

## 2014-01-25 ENCOUNTER — Other Ambulatory Visit: Payer: Self-pay

## 2014-01-25 MED ORDER — HYDROCODONE-ACETAMINOPHEN 5-325 MG PO TABS: ORAL_TABLET | ORAL | Status: DC

## 2014-01-25 NOTE — Telephone Encounter (Signed)
Pt notified Rx ready for pickup 

## 2014-01-25 NOTE — Telephone Encounter (Signed)
Px printed for pick up in IN box  

## 2014-01-25 NOTE — Telephone Encounter (Signed)
Pt left v/m requesting rx hydrocodone apap. Call when ready for pick up.  

## 2014-02-22 ENCOUNTER — Other Ambulatory Visit: Payer: Self-pay

## 2014-02-22 MED ORDER — CYCLOBENZAPRINE HCL 10 MG PO TABS: ORAL_TABLET | ORAL | Status: DC

## 2014-02-22 MED ORDER — HYDROCODONE-ACETAMINOPHEN 5-325 MG PO TABS: ORAL_TABLET | ORAL | Status: DC

## 2014-02-22 NOTE — Telephone Encounter (Signed)
Printed hydrocodone, sent flexeril. Thanks.

## 2014-02-22 NOTE — Telephone Encounter (Signed)
Patient notified by telephone that script is up front ready for pickup. 

## 2014-02-22 NOTE — Telephone Encounter (Signed)
Pt left v/m requesting rx hydrocodone apap. Call when ready for pick up. Pt also request refill [facility] to [facility].Please advise.

## 2014-03-08 DIAGNOSIS — A879 Viral meningitis, unspecified: Secondary | ICD-10-CM

## 2014-03-08 HISTORY — DX: Viral meningitis, unspecified: A87.9

## 2014-03-11 ENCOUNTER — Encounter (HOSPITAL_COMMUNITY): Payer: Self-pay | Admitting: Emergency Medicine

## 2014-03-11 ENCOUNTER — Emergency Department (INDEPENDENT_AMBULATORY_CARE_PROVIDER_SITE_OTHER)
Admission: EM | Admit: 2014-03-11 | Discharge: 2014-03-11 | Disposition: A | Discharge status: Other Acute Inpatient Hospital | Payer: Managed Care, Other (non HMO) | Source: Home / Self Care

## 2014-03-11 ENCOUNTER — Inpatient Hospital Stay (HOSPITAL_COMMUNITY)
Admission: EM | Admit: 2014-03-11 | Discharge: 2014-03-13 | DRG: 076 | Disposition: A | Discharge status: Home / Self Care | Payer: Managed Care, Other (non HMO) | Attending: Internal Medicine | Admitting: Internal Medicine

## 2014-03-11 ENCOUNTER — Emergency Department (HOSPITAL_COMMUNITY): Payer: Managed Care, Other (non HMO)

## 2014-03-11 DIAGNOSIS — J45909 Unspecified asthma, uncomplicated: Secondary | ICD-10-CM | POA: Diagnosis present

## 2014-03-11 DIAGNOSIS — M549 Dorsalgia, unspecified: Secondary | ICD-10-CM

## 2014-03-11 DIAGNOSIS — Z79899 Other long term (current) drug therapy: Secondary | ICD-10-CM

## 2014-03-11 DIAGNOSIS — R519 Headache, unspecified: Secondary | ICD-10-CM

## 2014-03-11 DIAGNOSIS — M436 Torticollis: Secondary | ICD-10-CM | POA: Diagnosis present

## 2014-03-11 DIAGNOSIS — G8929 Other chronic pain: Secondary | ICD-10-CM | POA: Diagnosis present

## 2014-03-11 DIAGNOSIS — R51 Headache: Secondary | ICD-10-CM

## 2014-03-11 DIAGNOSIS — Z791 Long term (current) use of non-steroidal anti-inflammatories (NSAID): Secondary | ICD-10-CM

## 2014-03-11 DIAGNOSIS — A879 Viral meningitis, unspecified: Secondary | ICD-10-CM | POA: Diagnosis present

## 2014-03-11 DIAGNOSIS — M545 Low back pain, unspecified: Secondary | ICD-10-CM | POA: Diagnosis present

## 2014-03-11 DIAGNOSIS — F909 Attention-deficit hyperactivity disorder, unspecified type: Secondary | ICD-10-CM | POA: Diagnosis present

## 2014-03-11 DIAGNOSIS — M129 Arthropathy, unspecified: Secondary | ICD-10-CM | POA: Diagnosis present

## 2014-03-11 DIAGNOSIS — Z8249 Family history of ischemic heart disease and other diseases of the circulatory system: Secondary | ICD-10-CM

## 2014-03-11 DIAGNOSIS — F17200 Nicotine dependence, unspecified, uncomplicated: Secondary | ICD-10-CM | POA: Diagnosis present

## 2014-03-11 DIAGNOSIS — Z888 Allergy status to other drugs, medicaments and biological substances status: Secondary | ICD-10-CM

## 2014-03-11 LAB — CBC WITH DIFFERENTIAL/PLATELET
BASOS ABS: 0 10*3/uL (ref 0.0–0.1)
BASOS PCT: 0 % (ref 0–1)
EOS ABS: 0 10*3/uL (ref 0.0–0.7)
Eosinophils Relative: 0 % (ref 0–5)
HCT: 44.7 % (ref 39.0–52.0)
HEMOGLOBIN: 15.8 g/dL (ref 13.0–17.0)
Lymphocytes Relative: 14 % (ref 12–46)
Lymphs Abs: 1.2 10*3/uL (ref 0.7–4.0)
MCH: 30.4 pg (ref 26.0–34.0)
MCHC: 35.3 g/dL (ref 30.0–36.0)
MCV: 86 fL (ref 78.0–100.0)
MONOS PCT: 7 % (ref 3–12)
Monocytes Absolute: 0.6 10*3/uL (ref 0.1–1.0)
NEUTROS ABS: 6.9 10*3/uL (ref 1.7–7.7)
NEUTROS PCT: 79 % — AB (ref 43–77)
PLATELETS: 274 10*3/uL (ref 150–400)
RBC: 5.2 MIL/uL (ref 4.22–5.81)
RDW: 12 % (ref 11.5–15.5)
WBC: 8.6 10*3/uL (ref 4.0–10.5)

## 2014-03-11 LAB — CSF CELL COUNT WITH DIFFERENTIAL
EOS CSF: 0 % (ref 0–1)
EOS CSF: 0 % (ref 0–1)
LYMPHS CSF: 13 % — AB (ref 40–80)
LYMPHS CSF: 21 % — AB (ref 40–80)
MONOCYTE-MACROPHAGE-SPINAL FLUID: 10 % — AB (ref 15–45)
MONOCYTE-MACROPHAGE-SPINAL FLUID: 12 % — AB (ref 15–45)
Other Cells, CSF: 0
Other Cells, CSF: 0
RBC COUNT CSF: 2 /mm3 — AB
RBC COUNT CSF: 53 /mm3 — AB
Segmented Neutrophils-CSF: 67 % — ABNORMAL HIGH (ref 0–6)
Segmented Neutrophils-CSF: 77 % — ABNORMAL HIGH (ref 0–6)
Tube #: 1
Tube #: 4
WBC, CSF: 21 /mm3 (ref 0–5)
WBC, CSF: 37 /mm3 (ref 0–5)

## 2014-03-11 LAB — URINALYSIS, ROUTINE W REFLEX MICROSCOPIC
Bilirubin Urine: NEGATIVE
Glucose, UA: NEGATIVE mg/dL
Hgb urine dipstick: NEGATIVE
Ketones, ur: 40 mg/dL — AB
Leukocytes, UA: NEGATIVE
Nitrite: NEGATIVE
PROTEIN: NEGATIVE mg/dL
Specific Gravity, Urine: 1.03 (ref 1.005–1.030)
UROBILINOGEN UA: 1 mg/dL (ref 0.0–1.0)
pH: 6 (ref 5.0–8.0)

## 2014-03-11 LAB — GRAM STAIN

## 2014-03-11 LAB — BASIC METABOLIC PANEL
Anion gap: 14 (ref 5–15)
BUN: 15 mg/dL (ref 6–23)
CO2: 26 mEq/L (ref 19–32)
Calcium: 10.1 mg/dL (ref 8.4–10.5)
Chloride: 97 mEq/L (ref 96–112)
Creatinine, Ser: 0.73 mg/dL (ref 0.50–1.35)
Glucose, Bld: 114 mg/dL — ABNORMAL HIGH (ref 70–99)
POTASSIUM: 3.9 meq/L (ref 3.7–5.3)
Sodium: 137 mEq/L (ref 137–147)

## 2014-03-11 LAB — GLUCOSE, CSF: GLUCOSE CSF: 73 mg/dL (ref 43–76)

## 2014-03-11 LAB — PROTEIN, CSF: Total  Protein, CSF: 103 mg/dL — ABNORMAL HIGH (ref 15–45)

## 2014-03-11 MED ORDER — HYDROMORPHONE HCL PF 1 MG/ML IJ SOLN
1.0000 mg | INTRAMUSCULAR | Status: DC | PRN
  Administered 2014-03-11 – 2014-03-13 (×7): 1 mg via INTRAVENOUS
  Filled 2014-03-11 (×7): qty 1

## 2014-03-11 MED ORDER — HYDROMORPHONE HCL PF 1 MG/ML IJ SOLN
1.0000 mg | Freq: Once | INTRAMUSCULAR | Status: AC
  Administered 2014-03-11: 1 mg via INTRAVENOUS
  Filled 2014-03-11: qty 1

## 2014-03-11 MED ORDER — SODIUM CHLORIDE 0.9 % IV BOLUS (SEPSIS)
1000.0000 mL | Freq: Once | INTRAVENOUS | Status: AC
  Administered 2014-03-11: 1000 mL via INTRAVENOUS

## 2014-03-11 MED ORDER — DEXTROSE 5 % IV SOLN
10.0000 mg/kg | Freq: Once | INTRAVENOUS | Status: AC
  Administered 2014-03-11: 770 mg via INTRAVENOUS
  Filled 2014-03-11: qty 15.4

## 2014-03-11 MED ORDER — SODIUM CHLORIDE 0.9 % IV SOLN
INTRAVENOUS | Status: AC
  Administered 2014-03-11 – 2014-03-12 (×2): via INTRAVENOUS

## 2014-03-11 MED ORDER — DEXTROSE 5 % IV SOLN
2.0000 g | Freq: Two times a day (BID) | INTRAVENOUS | Status: DC
  Administered 2014-03-11 – 2014-03-13 (×4): 2 g via INTRAVENOUS
  Filled 2014-03-11 (×4): qty 2

## 2014-03-11 MED ORDER — DIPHENHYDRAMINE HCL 50 MG/ML IJ SOLN
25.0000 mg | Freq: Once | INTRAMUSCULAR | Status: AC
  Administered 2014-03-11: 25 mg via INTRAVENOUS
  Filled 2014-03-11: qty 1

## 2014-03-11 MED ORDER — VANCOMYCIN HCL IN DEXTROSE 1-5 GM/200ML-% IV SOLN
1000.0000 mg | Freq: Three times a day (TID) | INTRAVENOUS | Status: DC
  Filled 2014-03-11: qty 200

## 2014-03-11 MED ORDER — VANCOMYCIN HCL 10 G IV SOLR
2000.0000 mg | Freq: Once | INTRAVENOUS | Status: AC
  Administered 2014-03-11: 2000 mg via INTRAVENOUS
  Filled 2014-03-11: qty 2000

## 2014-03-11 MED ORDER — METOCLOPRAMIDE HCL 5 MG/ML IJ SOLN
10.0000 mg | Freq: Once | INTRAMUSCULAR | Status: AC
  Administered 2014-03-11: 10 mg via INTRAVENOUS
  Filled 2014-03-11: qty 2

## 2014-03-11 NOTE — ED Notes (Signed)
CRITICAL VALUE ALERT  Critical value received:  1st vial of CSF WBC 21, 4th vial of CSF WBC 37, Gram stain + WBC noted--but no organisms were seen   Date of notification:  03/11/2014  Time of notification:  2104  Critical value read back:Yes.    Nurse who received alert:  Tama GanderPosten, Pattricia Weiher

## 2014-03-11 NOTE — Discharge Instructions (Signed)
General Headache Without Cause °A headache is pain or discomfort felt around the head or neck area. The specific cause of a headache may not be found. There are many causes and types of headaches. A few common ones are: °· Tension headaches. °· Migraine headaches. °· Cluster headaches. °· Chronic daily headaches. °HOME CARE INSTRUCTIONS  °· Keep all follow-up appointments with your caregiver or any specialist referral. °· Only take over-the-counter or prescription medicines for pain or discomfort as directed by your caregiver. °· Lie down in a dark, quiet room when you have a headache. °· Keep a headache journal to find out what may trigger your migraine headaches. For example, write down: °· What you eat and drink. °· How much sleep you get. °· Any change to your diet or medicines. °· Try massage or other relaxation techniques. °· Put ice packs or heat on the head and neck. Use these 3 to 4 times per day for 15 to 20 minutes each time, or as needed. °· Limit stress. °· Sit up straight, and do not tense your muscles. °· Quit smoking if you smoke. °· Limit alcohol use. °· Decrease the amount of caffeine you drink, or stop drinking caffeine. °· Eat and sleep on a regular schedule. °· Get 7 to 9 hours of sleep, or as recommended by your caregiver. °· Keep lights dim if bright lights bother you and make your headaches worse. °SEEK MEDICAL CARE IF:  °· You have problems with the medicines you were prescribed. °· Your medicines are not working. °· You have a change from the usual headache. °· You have nausea or vomiting. °SEEK IMMEDIATE MEDICAL CARE IF:  °· Your headache becomes severe. °· You have a fever. °· You have a stiff neck. °· You have loss of vision. °· You have muscular weakness or loss of muscle control. °· You start losing your balance or have trouble walking. °· You feel faint or pass out. °· You have severe symptoms that are different from your first symptoms. °MAKE SURE YOU:  °· Understand these  instructions. °· Will watch your condition. °· Will get help right away if you are not doing well or get worse. °Document Released: 08/25/2005 Document Revised: 11/17/2011 Document Reviewed: 09/10/2011 °ExitCare® Patient Information ©2015 [employer]. This information is not intended to replace advice given to you by your health care provider. Make sure you discuss any questions you have with your health care provider. ° °Headaches, Frequently Asked Questions °MIGRAINE HEADACHES °Q: What is migraine? What causes it? How can I treat it? °A: Generally, migraine headaches begin as a dull ache. Then they develop into a constant, throbbing, and pulsating pain. You may experience pain at the temples. You may experience pain at the front or back of one or both sides of the head. The pain is usually accompanied by a combination of: °· Nausea. °· Vomiting. °· Sensitivity to light and noise. °Some people (about 15%) experience an aura (see below) before an attack. The cause of migraine is believed to be chemical reactions in the brain. Treatment for migraine may include over-the-counter or prescription medications. It may also include self-help techniques. These include relaxation training and biofeedback.  °Q: What is an aura? °A: About 15% of people with migraine get an "aura". This is a sign of neurological symptoms that occur before a migraine headache. You may see wavy or jagged lines, dots, or flashing lights. You might experience tunnel vision or blind spots in one or both eyes. The   aura can include visual or auditory hallucinations (something imagined). It may include disruptions in smell (such as strange odors), taste or touch. Other symptoms include: °· Numbness. °· A "pins and needles" sensation. °· Difficulty in recalling or speaking the correct word. °These neurological events may last as long as 60 minutes. These symptoms will fade as the headache begins. °Q: What is a trigger? °A: Certain physical or  environmental factors can lead to or "trigger" a migraine. These include: °· Foods. °· Hormonal changes. °· Weather. °· Stress. °It is important to remember that triggers are different for everyone. To help prevent migraine attacks, you need to figure out which triggers affect you. Keep a headache diary. This is a good way to track triggers. The diary will help you talk to your healthcare professional about your condition. °Q: Does weather affect migraines? °A: Bright sunshine, hot, humid conditions, and drastic changes in barometric pressure may lead to, or "trigger," a migraine attack in some people. But studies have shown that weather does not act as a trigger for everyone with migraines. °Q: What is the link between migraine and hormones? °A: Hormones start and regulate many of your body's functions. Hormones keep your body in balance within a constantly changing environment. The levels of hormones in your body are unbalanced at times. Examples are during menstruation, pregnancy, or menopause. That can lead to a migraine attack. In fact, about three quarters of all women with migraine report that their attacks are related to the menstrual cycle.  °Q: Is there an increased risk of stroke for migraine sufferers? °A: The likelihood of a migraine attack causing a stroke is very remote. That is not to say that migraine sufferers cannot have a stroke associated with their migraines. In persons under age 40, the most common associated factor for stroke is migraine headache. But over the course of a person's normal life span, the occurrence of migraine headache may actually be associated with a reduced risk of dying from cerebrovascular disease due to stroke.  °Q: What are acute medications for migraine? °A: Acute medications are used to treat the pain of the headache after it has started. Examples over-the-counter medications, NSAIDs, ergots, and triptans.  °Q: What are the triptans? °A: Triptans are the newest class  of abortive medications. They are specifically targeted to treat migraine. Triptans are vasoconstrictors. They moderate some chemical reactions in the brain. The triptans work on receptors in your brain. Triptans help to restore the balance of a neurotransmitter called serotonin. Fluctuations in levels of serotonin are thought to be a main cause of migraine.  °Q: Are over-the-counter medications for migraine effective? °A: Over-the-counter, or "OTC," medications may be effective in relieving mild to moderate pain and associated symptoms of migraine. But you should see your caregiver before beginning any treatment regimen for migraine.  °Q: What are preventive medications for migraine? °A: Preventive medications for migraine are sometimes referred to as "prophylactic" treatments. They are used to reduce the frequency, severity, and length of migraine attacks. Examples of preventive medications include antiepileptic medications, antidepressants, beta-blockers, calcium channel blockers, and NSAIDs (nonsteroidal anti-inflammatory drugs). °Q: Why are anticonvulsants used to treat migraine? °A: During the past few years, there has been an increased interest in antiepileptic drugs for the prevention of migraine. They are sometimes referred to as "anticonvulsants". Both epilepsy and migraine may be caused by similar reactions in the brain.  °Q: Why are antidepressants used to treat migraine? °A: Antidepressants are typically used to treat people with   depression. They may reduce migraine frequency by regulating chemical levels, such as serotonin, in the brain.  °Q: What alternative therapies are used to treat migraine? °A: The term "alternative therapies" is often used to describe treatments considered outside the scope of conventional Western medicine. Examples of alternative therapy include acupuncture, acupressure, and yoga. Another common alternative treatment is herbal therapy. Some herbs are believed to relieve  headache pain. Always discuss alternative therapies with your caregiver before proceeding. Some herbal products contain arsenic and other toxins. °TENSION HEADACHES °Q: What is a tension-type headache? What causes it? How can I treat it? °A: Tension-type headaches occur randomly. They are often the result of temporary stress, anxiety, fatigue, or anger. Symptoms include soreness in your temples, a tightening band-like sensation around your head (a "vice-like" ache). Symptoms can also include a pulling feeling, pressure sensations, and contracting head and neck muscles. The headache begins in your forehead, temples, or the back of your head and neck. Treatment for tension-type headache may include over-the-counter or prescription medications. Treatment may also include self-help techniques such as relaxation training and biofeedback. °CLUSTER HEADACHES °Q: What is a cluster headache? What causes it? How can I treat it? °A: Cluster headache gets its name because the attacks come in groups. The pain arrives with little, if any, warning. It is usually on one side of the head. A tearing or bloodshot eye and a runny nose on the same side of the headache may also accompany the pain. Cluster headaches are believed to be caused by chemical reactions in the brain. They have been described as the most severe and intense of any headache type. Treatment for cluster headache includes prescription medication and oxygen. °SINUS HEADACHES °Q: What is a sinus headache? What causes it? How can I treat it? °A: When a cavity in the bones of the face and skull (a sinus) becomes inflamed, the inflammation will cause localized pain. This condition is usually the result of an allergic reaction, a tumor, or an infection. If your headache is caused by a sinus blockage, such as an infection, you will probably have a fever. An x-ray will confirm a sinus blockage. Your caregiver's treatment might include antibiotics for the infection, as well as  antihistamines or decongestants.  °REBOUND HEADACHES °Q: What is a rebound headache? What causes it? How can I treat it? °A: A pattern of taking acute headache medications too often can lead to a condition known as "rebound headache." A pattern of taking too much headache medication includes taking it more than 2 days per week or in excessive amounts. That means more than the label or a caregiver advises. With rebound headaches, your medications not only stop relieving pain, they actually begin to cause headaches. Doctors treat rebound headache by tapering the medication that is being overused. Sometimes your caregiver will gradually substitute a different type of treatment or medication. Stopping may be a challenge. Regularly overusing a medication increases the potential for serious side effects. Consult a caregiver if you regularly use headache medications more than 2 days per week or more than the label advises. °ADDITIONAL QUESTIONS AND ANSWERS °Q: What is biofeedback? °A: Biofeedback is a self-help treatment. Biofeedback uses special equipment to monitor your body's involuntary physical responses. Biofeedback monitors: °· Breathing. °· Pulse. °· Heart rate. °· Temperature. °· Muscle tension. °· Brain activity. °Biofeedback helps you refine and perfect your relaxation exercises. You learn to control the physical responses that are related to stress. Once the technique has been mastered, you   do not need the equipment any more. °Q: Are headaches hereditary? °A: Four out of five (80%) of people that suffer report a family history of migraine. Scientists are not sure if this is genetic or a family predisposition. Despite the uncertainty, a child has a 50% chance of having migraine if one parent suffers. The child has a 75% chance if both parents suffer.  °Q: Can children get headaches? °A: By the time they reach high school, most young people have experienced some type of headache. Many safe and effective  approaches or medications can prevent a headache from occurring or stop it after it has begun.  °Q: What type of doctor should I see to diagnose and treat my headache? °A: Start with your primary caregiver. Discuss his or her experience and approach to headaches. Discuss methods of classification, diagnosis, and treatment. Your caregiver may decide to recommend you to a headache specialist, depending upon your symptoms or other physical conditions. Having diabetes, allergies, etc., may require a more comprehensive and inclusive approach to your headache. The National Head[employer] Foundation will provide, upon request, a list of NHF physician members in your state. °Document Released: 11/15/2003 Document Revised: 11/17/2011 Document Reviewed: 04/24/2008 °ExitCare® Patient Information ©2015 [employer]. This information is not intended to replace advice given to you by your health care provider. Make sure you discuss any questions you have with your health care provider. ° °

## 2014-03-11 NOTE — ED Notes (Addendum)
Patient vomited x 4 since Friday night.  Spouse states he is not eating or drinking much.  Denies fever.  Takes Norco for a spinal injury, last dose yesterday at 5pm.  Has tried Motrin and Excedrin with no relief.

## 2014-03-11 NOTE — ED Notes (Signed)
Pt reports HA for past 2 days. Pt states pain is generalized over head. Pt has been vomiting for 2 days, will keep down medication, but nothing else. Pain worse with head movement and sound. No recent head trauma. Denies neck pain.

## 2014-03-11 NOTE — Progress Notes (Signed)
ANTIBIOTIC CONSULT NOTE - INITIAL  Pharmacy Consult for Vancomycin and Rocephin Indication: R/o Meningtitis  Allergies  Allergen Reactions  . Chantix [Varenicline Tartrate] Other (See Comments)    Bad dreams    Patient Measurements: Height: 5\' 10"  (177.8 cm) Weight: 170 lb (77.111 kg) IBW/kg (Calculated) : 73   Vital Signs: Temp: 98.3 F (36.8 C) (07/04 1441) Temp src: Oral (07/04 1441) BP: 131/80 mmHg (07/04 2028) Pulse Rate: 73 (07/04 2028) Intake/Output from previous day:   Intake/Output from this shift:    Labs:  Recent Labs  03/11/14 1515  WBC 8.6  HGB 15.8  PLT 274  CREATININE 0.73   Estimated Creatinine Clearance: 141.9 ml/min (by C-G formula based on Cr of 0.73). No results found for this basename: VANCOTROUGH, VANCOPEAK, VANCORANDOM, GENTTROUGH, GENTPEAK, GENTRANDOM, TOBRATROUGH, TOBRAPEAK, TOBRARND, AMIKACINPEAK, AMIKACINTROU, AMIKACIN,  in the last 72 hours   Microbiology: Recent Results (from the past 720 hour(s))  GRAM STAIN     Status: None   Collection Time    03/11/14  6:30 PM      Result Value Ref Range Status   Specimen Description CSF   Final   Special Requests NONE   Final   Gram Stain     Final   Value: WBC SEEN     NO ORGANISMS SEEN     Gram Stain Report Called to,Read Back By and Verified With: POSTEN,E AT 2100 ON [id number] BY HOOKER,B   Report Status 03/11/2014 FINAL   Final    Medical History: Past Medical History  Diagnosis Date  . Back pain     chronic low back pain, with L%-S1 stenosis/bulge sisc/arthropathy  . Closed lumbar fx w/ cord inj 2010    hx lumbar fx  . Reactive airways dysfunction syndrome     with illnesses    Medications:  Scheduled:   Infusions:  . acyclovir    . cefTRIAXone (ROCEPHIN)  IV    . vancomycin    . [START ON 03/12/2014] vancomycin     Assessment: 28 yo c/o HA, vomiting x 2 days.  Rocephin and Vancomycin per Rx for r/o meningitis.   Goal of Therapy:  Vancomycin trough level 15-20  mcg/ml  Plan:   Vancomycin 2Gm x1 then 1Gm IV q8h  Rocephin 2Gm IV q12h  F/U SCr/levels/cultures  Susanne GreenhouseGreen, Saintclair Schroader R 03/11/2014,9:49 PM

## 2014-03-11 NOTE — ED Provider Notes (Signed)
CSN: [id number]     Arrival date & time 03/11/14  1314 History   First MD Initiated Contact with Patient 03/11/14 1325     Chief Complaint  Patient presents with  . Headache   (Consider location/radiation/quality/duration/timing/severity/associated sxs/prior Treatment) HPI Comments: 28 year old male presents with his significant other to the urgent care for a severe headache for 2 days. It is associated with vomiting, decreased appetite and neck pain. He states this is the worst headache of his entire life. He takes Norco 2 tablets 4-5 times a day and this is not helping with his pain. It is difficult to obtain a history as he sits in a wheelchair, does not move unless commanded to do so and mumbles quietly. No current vomiting.   Past Medical History  Diagnosis Date  . Back pain     chronic low back pain, with L%-S1 stenosis/bulge sisc/arthropathy  . Closed lumbar fx w/ cord inj 2010    hx lumbar fx  . Reactive airways dysfunction syndrome     with illnesses   Past Surgical History  Procedure Laterality Date  . Back surgery     Family History  Problem Relation Age of Onset  . Hypertension Mother    History  Substance Use Topics  . Smoking status: Current Every Day Smoker -- 0.50 packs/day    Types: Cigarettes  . Smokeless tobacco: Never Used  . Alcohol Use: Yes     Comment: rarely on holidays    Review of Systems  Constitutional: Positive for activity change and appetite change. Negative for fever.  HENT: Negative for congestion, ear pain, sinus pressure and trouble swallowing.   Eyes: Positive for photophobia and visual disturbance.  Respiratory: Negative.   Cardiovascular: Negative.   Gastrointestinal: Positive for nausea and vomiting.  Genitourinary: Negative.   Musculoskeletal: Positive for back pain and neck pain.  Skin: Negative.   Neurological: Positive for weakness and headaches. Negative for dizziness, tremors, syncope and facial asymmetry.    Allergies   Chantix and Oxycodone-acetaminophen  Home Medications   Prior to Admission medications   Medication Sig Start Date End Date Taking? Authorizing Provider  cetirizine (ZYRTEC) 10 MG tablet Take 10 mg by mouth daily as needed. Allergies.    Historical Provider, MD  cyclobenzaprine (FLEXERIL) 10 MG tablet TAKE ONE TABLET BY MOUTH 3 TIMES DAILY AS NEEDED. 02/22/14   Joaquim NamGraham S Duncan, MD  HYDROcodone-acetaminophen (NORCO/VICODIN) 5-325 MG per tablet TAKE 1 TO 2 TABLETS BY MOUTH 3 TIMES A DAY AS NEEDED AS DIRECTED. 02/22/14   Joaquim NamGraham S Duncan, MD   BP 137/88  Pulse 95  Temp(Src) 98.2 F (36.8 C) (Oral)  Resp 18  SpO2 99% Physical Exam  Nursing note and vitals reviewed. Constitutional: He is oriented to person, place, and time. He appears well-developed and well-nourished. No distress.  HENT:  Mouth/Throat: Oropharynx is clear and moist. No oropharyngeal exudate.  Eyes: Conjunctivae and EOM are normal. Pupils are equal, round, and reactive to light.  Neck: Neck supple.  No cervical tenderness, deformity, discoloration or swelling.  Cardiovascular: Normal rate and regular rhythm.   Pulmonary/Chest: Effort normal and breath sounds normal.  Neurological: He is alert and oriented to person, place, and time. No cranial nerve deficit.  Follows most commands for testing CN's but performs the minimal requested. Prefers to sit and remain in a seated position without voluntary movement.  Skin: Skin is warm and dry.  Psychiatric: His speech is delayed. He is slowed and withdrawn.    ED  Course  Procedures (including critical care time) Labs Review Labs Reviewed - No data to display  Imaging Review No results found.   MDM   1. Headache disorder     As this is the worse headache of his life, poor and weak vocal and motor response [facility] and [facility]. Pt takes Norco on a daily basis for chronic back pain. Suspect psychologic  overlay affecting behavior and cooperation. Query secondary gain.      Hayden Rasmussenavid Hieu Herms, NP 03/11/14 1425

## 2014-03-11 NOTE — ED Provider Notes (Signed)
CSN: [id number]     Arrival date & time 03/11/14  1432 History   None    Chief Complaint  Patient presents with  . Headache  . Emesis   HPI  Jacob Newton is a 28 y.o. male with a PMH of closed lumbar fx with cord injury, reactive airway dysfunction syndrome, and back pain who presents to the ED for evaluation of headache and vomiting. History was provided by the patient and his wife. Patient has had a severe constant headache for the past 2 days. Headache has been gradually getting worse. Headache is located generalized throughout. Worse with bright lights and loud noise. Patient's treatment includes norco (for chronic back pain), ice packs, and Advil with no relief. He denies any injuries or trauma. Headache is "the worst headache of my life." He also has had vision changes (but cannot describe this), neck stiffness/pain, vomiting x 2 days, nausea, generalized weakness, fatigue, chills, decreased appetite/intake, dizziness, lightheadedness, ataxia, and subjective fever. He denies any hx of migraines in the past, but did see his PCP for headaches 1.5 years ago and was prescribed an unknown medication, but stopped taking this 6 months ago. Patient has numbness at baseline in his legs from his prior spinal cord injury. Patient initially went to urgent care and sent to ED for further evaluation.    Past Medical History  Diagnosis Date  . Back pain     chronic low back pain, with L%-S1 stenosis/bulge sisc/arthropathy  . Closed lumbar fx w/ cord inj 2010    hx lumbar fx  . Reactive airways dysfunction syndrome     with illnesses   Past Surgical History  Procedure Laterality Date  . Back surgery     Family History  Problem Relation Age of Onset  . Hypertension Mother    History  Substance Use Topics  . Smoking status: Current Every Day Smoker -- 0.50 packs/day    Types: Cigarettes  . Smokeless tobacco: Never Used  . Alcohol Use: Yes     Comment: rarely on holidays    Review of  Systems  Constitutional: Positive for fever (subjective), chills, activity change, appetite change and fatigue.  HENT: Negative for congestion, rhinorrhea and sore throat.   Eyes: Positive for photophobia and visual disturbance. Negative for pain.  Respiratory: Negative for cough and shortness of breath.   Cardiovascular: Negative for chest pain and leg swelling.  Gastrointestinal: Positive for nausea and vomiting. Negative for abdominal pain, diarrhea and constipation.  Genitourinary: Negative for dysuria and difficulty urinating.  Musculoskeletal: Positive for back pain (chronic), gait problem, myalgias, neck pain and neck stiffness.  Skin: Negative for rash.  Neurological: Positive for dizziness, weakness (generalized), light-headedness, numbness (baseline) and headaches. Negative for syncope.  Psychiatric/Behavioral: Negative for confusion.    Allergies  Chantix and Oxycodone-acetaminophen  Home Medications   Prior to Admission medications   Medication Sig Start Date End Date Taking? Authorizing Provider  cetirizine (ZYRTEC) 10 MG tablet Take 10 mg by mouth daily as needed. Allergies.    Historical Provider, MD  cyclobenzaprine (FLEXERIL) 10 MG tablet TAKE ONE TABLET BY MOUTH 3 TIMES DAILY AS NEEDED. 02/22/14   Joaquim Nam, MD  HYDROcodone-acetaminophen (NORCO/VICODIN) 5-325 MG per tablet TAKE 1 TO 2 TABLETS BY MOUTH 3 TIMES A DAY AS NEEDED AS DIRECTED. 02/22/14   Joaquim Nam, MD   BP 131/84  Pulse 85  Temp(Src) 98.3 F (36.8 C) (Oral)  Resp 16  SpO2 100%  Filed Vitals:  03/11/14 1630 03/11/14 2028 03/11/14 2124 03/11/14 2313  BP: 141/73 131/80  139/78  Pulse: 87 73  69  Temp:    99.7 F (37.6 C)  TempSrc:    Oral  Resp:  14  15  Height:   5\' 10"  (1.778 m)   Weight:   170 lb (77.111 kg)   SpO2: 100% 100%  100%    Physical Exam  Nursing note and vitals reviewed. Constitutional: He is oriented to person, place, and time. He appears well-developed and  well-nourished. No distress.  Ill appearing   HENT:  Head: Normocephalic and atraumatic.  Right Ear: External ear normal.  Left Ear: External ear normal.  Nose: Nose normal.  Mouth/Throat: Oropharynx is clear and moist.  No tenderness to the scalp or face throughout. No palpable hematoma, step-offs, or lacerations throughout.  Tympanic membranes gray and translucent bilaterally.    Eyes: Conjunctivae and EOM are normal. Pupils are equal, round, and reactive to light. Right eye exhibits no discharge. Left eye exhibits no discharge.  Neck: Normal range of motion. Neck supple.  Patient complains of increased pain with movement but no nuchal rigidity.   Cardiovascular: Normal rate, regular rhythm, normal heart sounds and intact distal pulses.  Exam reveals no gallop and no friction rub.   No murmur heard. Pulmonary/Chest: Effort normal and breath sounds normal. No respiratory distress. He has no wheezes. He has no rales. He exhibits no tenderness.  Abdominal: Soft. He exhibits no distension. There is no tenderness.  Musculoskeletal: Normal range of motion. He exhibits no edema and no tenderness.  Strength 5/5 in the upper and lower extremities bilaterally. Patient feels lightheaded upon standing.   Neurological: He is alert and oriented to person, place, and time.  GCS 15. No focal neurological deficits. CN 2-12 intact. No pronator drift. Finger to nose intact. Heel to shin intact. Gross sensation intact in the UE and LE bilaterally  Skin: Skin is warm and dry. He is not diaphoretic.     ED Course  LUMBAR PUNCTURE Date/Time: 03/11/2014 6:25 PM Performed by: Coral CeoPALMER, Rendell Thivierge K Authorized by: Jillyn LedgerPALMER, Edsel Shives K Consent: Verbal consent obtained. written consent obtained. Risks and benefits: risks, benefits and alternatives were discussed Consent given by: patient Patient understanding: patient states understanding of the procedure being performed Patient consent: the patient's understanding of  the procedure matches consent given Procedure consent: procedure consent matches procedure scheduled Relevant documents: relevant documents present and verified Test results: test results available and properly labeled Site marked: the operative site was marked Patient identity confirmed: verbally with patient Time out: Immediately prior to procedure a "time out" was called to verify the correct patient, procedure, equipment, support staff and site/side marked as required. Indications: evaluation for infection Anesthesia: local infiltration Local anesthetic: lidocaine 1% without epinephrine Anesthetic total: 3 ml Patient sedated: no Preparation: Patient was prepped and draped in the usual sterile fashion. Lumbar space: L3-L4 interspace Patient's position: left lateral decubitus Needle gauge: 18 Needle type: spinal needle - Quincke tip Needle length: 1.5 in Number of attempts: 1 Fluid appearance: clear Tubes of fluid: 4 Total volume: 8 ml Post-procedure: site cleaned and adhesive bandage applied Patient tolerance: Patient tolerated the procedure well with no immediate complications.   (including critical care time) Labs Review Labs Reviewed - No data to display  Imaging Review Ct Head Wo Contrast  03/11/2014   CLINICAL DATA:  Headaches  EXAM: CT HEAD WITHOUT CONTRAST  TECHNIQUE: Contiguous axial images were obtained from the base of the  skull through the vertex without intravenous contrast.  COMPARISON:  None.  FINDINGS: The bony calvarium is intact. The ventricles are of normal size and configuration. No findings to suggest acute hemorrhage, acute infarction or space-occupying mass lesion are noted.  IMPRESSION: No acute intracranial abnormality noted.   Electronically Signed   By: Alcide CleverMark  Lukens M.D.   On: 03/11/2014 15:59     EKG Interpretation None      Results for orders placed during the hospital encounter of 03/11/14  [facility]      Result Value Ref Range   Specimen  Description CSF     Special Requests NONE     Gram Stain       Value: WBC SEEN     NO ORGANISMS SEEN     Gram Stain Report Called to,Read Back By and Verified With: POSTEN,E AT 2100 ON [id number] BY HOOKER,B   Report Status 03/11/2014 FINAL    CBC WITH DIFFERENTIAL      Result Value Ref Range   WBC 8.6  4.0 - 10.5 K/uL   RBC 5.20  4.22 - 5.81 MIL/uL   Hemoglobin 15.8  13.0 - 17.0 g/dL   HCT 04.544.7  40.939.0 - 81.152.0 %   MCV 86.0  78.0 - 100.0 fL   MCH 30.4  26.0 - 34.0 pg   MCHC 35.3  30.0 - 36.0 g/dL   RDW 91.412.0  78.211.5 - 95.615.5 %   Platelets 274  150 - 400 K/uL   Neutrophils Relative % 79 (*) 43 - 77 %   Neutro Abs 6.9  1.7 - 7.7 K/uL   Lymphocytes Relative 14  12 - 46 %   Lymphs Abs 1.2  0.7 - 4.0 K/uL   Monocytes Relative 7  3 - 12 %   Monocytes Absolute 0.6  0.1 - 1.0 K/uL   Eosinophils Relative 0  0 - 5 %   Eosinophils Absolute 0.0  0.0 - 0.7 K/uL   Basophils Relative 0  0 - 1 %   Basophils Absolute 0.0  0.0 - 0.1 K/uL  BASIC METABOLIC PANEL      Result Value Ref Range   Sodium 137  137 - 147 mEq/L   Potassium 3.9  3.7 - 5.3 mEq/L   Chloride 97  96 - 112 mEq/L   CO2 26  19 - 32 mEq/L   Glucose, Bld 114 (*) 70 - 99 mg/dL   BUN 15  6 - 23 mg/dL   Creatinine, Ser 2.130.73  0.50 - 1.35 mg/dL   Calcium 08.610.1  8.4 - 57.810.5 mg/dL   GFR calc non Af Amer >90  >90 mL/min   GFR calc Af Amer >90  >90 mL/min   Anion gap 14  5 - 15  URINALYSIS, ROUTINE W REFLEX MICROSCOPIC      Result Value Ref Range   Color, Urine AMBER (*) YELLOW   APPearance CLEAR  CLEAR   Specific Gravity, Urine 1.030  1.005 - 1.030   pH 6.0  5.0 - 8.0   Glucose, UA NEGATIVE  NEGATIVE mg/dL   Hgb urine dipstick NEGATIVE  NEGATIVE   Bilirubin Urine NEGATIVE  NEGATIVE   Ketones, ur 40 (*) NEGATIVE mg/dL   Protein, ur NEGATIVE  NEGATIVE mg/dL   Urobilinogen, UA 1.0  0.0 - 1.0 mg/dL   Nitrite NEGATIVE  NEGATIVE   Leukocytes, UA NEGATIVE  NEGATIVE  CSF CELL COUNT WITH DIFFERENTIAL      Result Value Ref Range   Tube #  1      Color, CSF COLORLESS  COLORLESS   Appearance, CSF CLEAR  CLEAR   Supernatant NOT INDICATED     RBC Count, CSF 53 (*) 0 /cu mm   WBC, CSF 21 (*) 0 - 5 /cu mm   Segmented Neutrophils-CSF 77 (*) 0 - 6 %   Lymphs, CSF 13 (*) 40 - 80 %   Monocyte-Macrophage-Spinal Fluid 10 (*) 15 - 45 %   Eosinophils, CSF 0  0 - 1 %   Other Cells, CSF 0    CSF CELL COUNT WITH DIFFERENTIAL      Result Value Ref Range   Tube # 4     Color, CSF COLORLESS  COLORLESS   Appearance, CSF CLEAR  CLEAR   Supernatant NOT INDICATED     RBC Count, CSF 2 (*) 0 /cu mm   WBC, CSF 37 (*) 0 - 5 /cu mm   Segmented Neutrophils-CSF 67 (*) 0 - 6 %   Lymphs, CSF 21 (*) 40 - 80 %   Monocyte-Macrophage-Spinal Fluid 12 (*) 15 - 45 %   Eosinophils, CSF 0  0 - 1 %   Other Cells, CSF 0    GLUCOSE, CSF      Result Value Ref Range   Glucose, CSF 73  43 - 76 mg/dL  PROTEIN, CSF      Result Value Ref Range   Total  Protein, CSF 103 (*) 15 - 45 mg/dL     MDM   Jacob Newton is a 28 y.o. male with a PMH of closed lumbar fx with cord injury, reactive airway dysfunction syndrome, and back pain who presents to the ED for evaluation of headache and vomiting. Headache may be due to a viral meningitis. Patient's CSF shows elevated WBC count (21 and 37) with no MO on gram stain. Patient started on Acyclovir. Vancomycin and Ceftriaxone also started for possible bacterial causes, although this is less likely. Patient had little improvements in his headache throughout his ED visit. Head CT negative for an intracranial process. No focal neurological deficits. Patient admitted for further evaluation and management.   Rechecks  4:15 PM = Pain 8/10 from a 9/10. Ordering 1 mg dilaudid.  5:00 PM = Pain 5/10 after dilaudid.  8:00 PM = Pain improved after another dose of dilaudid. Upset has to lay flat, but informed of importance of laying flat.  9:15 PM = Patient and wife informed of results and are in agreement with admission.    Final  impressions: 1. Viral meningitis       Luiz Iron PA-C   This patient was discussed with Dr. Vinetta Bergamo, PA-C 03/11/14 [phone]

## 2014-03-11 NOTE — H&P (Signed)
PCP:  Roxy Manns, MD    Chief Complaint:  Headache for 2 days  HPI: Jacob Newton is a 28 y.o. male   has a past medical history of Back pain; Closed lumbar fx w/ cord inj (2010); and Reactive airways dysfunction syndrome.   Presented with  2 day hx of worsening headache, meningismus and neck stiffness he was initially seen at urgent care and sent  To ER. In ER LP showed WBC 37, no organisms seen. HE was started on acyclovir.   Hospitalist was called for admission for viral meningitis.   Review of Systems:    Pertinent positives include:  chills, fatigue,  Constitutional:  No weight loss, night sweats, Fevers,  weight loss  HEENT:  No headaches, Difficulty swallowing,Tooth/dental problems,Sore throat,  No sneezing, itching, ear ache, nasal congestion, post nasal drip,  Cardio-vascular:  No chest pain, Orthopnea, PND, anasarca, dizziness, palpitations.no Bilateral lower extremity swelling  GI:  No heartburn, indigestion, abdominal pain, nausea, vomiting, diarrhea, change in bowel habits, loss of appetite, melena, blood in stool, hematemesis Resp:  no shortness of breath at rest. No dyspnea on exertion, No excess mucus, no productive cough, No non-productive cough, No coughing up of blood.No change in color of mucus.No wheezing. Skin:  no rash or lesions. No jaundice GU:  no dysuria, change in color of urine, no urgency or frequency. No straining to urinate.  No flank pain.  Musculoskeletal:  No joint pain or no joint swelling. No decreased range of motion. No back pain.  Psych:  No change in mood or affect. No depression or anxiety. No memory loss.  Neuro: no localizing neurological complaints, no tingling, no weakness, no double vision, no gait abnormality, no slurred speech, no confusion  Otherwise ROS are negative except for above, 10 systems were reviewed  Past Medical History: Past Medical History  Diagnosis Date  . Back pain     chronic low back pain,  with L%-S1 stenosis/bulge sisc/arthropathy  . Closed lumbar fx w/ cord inj 2010    hx lumbar fx  . Reactive airways dysfunction syndrome     with illnesses   Past Surgical History  Procedure Laterality Date  . Back surgery       Medications: Prior to Admission medications   Medication Sig Start Date End Date Taking? Authorizing Provider  aspirin-acetaminophen-caffeine (EXCEDRIN MIGRAINE) [phone] MG per tablet Take 2 tablets by mouth every 6 (six) hours as needed for headache.   Yes Historical Provider, MD  HYDROcodone-acetaminophen (NORCO/VICODIN) 5-325 MG per tablet Take 2 tablets by mouth every 4 (four) hours as needed for moderate pain.   Yes Historical Provider, MD  ibuprofen (ADVIL,MOTRIN) 200 MG tablet Take 600 mg by mouth every 6 (six) hours as needed for headache or moderate pain.   Yes Historical Provider, MD    Allergies:   Allergies  Allergen Reactions  . Chantix [Varenicline Tartrate] Other (See Comments)    Bad dreams    Social History:  Ambulatory  independently Lives at home With family has 5 underaged children at home    reports that he has been smoking Cigarettes.  He has been smoking about 0.50 packs per day. He has never used smokeless tobacco. He reports that he drinks alcohol. He reports that he does not use illicit drugs.    Family History: family history includes Hypertension in his mother.    Physical Exam: Patient Vitals for the past 24 hrs:  BP Temp Temp src Pulse Resp SpO2 Height Weight  03/11/14 2124 - - - - - - 5\' 10"  (1.778 m) 77.111 kg (170 lb)  03/11/14 2028 131/80 mmHg - - 73 14 100 % - -  03/11/14 1630 141/73 mmHg - - 87 - 100 % - -  03/11/14 1441 131/84 mmHg 98.3 F (36.8 C) Oral 85 16 100 % - -    1. General: Appears unwell 2. Psychological: Alert and  Oriented 3. Head/ENT:   Dry Mucous Membranes                          Head Non traumatic, neck stiffness noted meningismus noted                          Normal   Dentition 4. SKIN:   decreased Skin turgor,  Skin clean Dry and intact no rash 5. Heart: Regular rate and rhythm no Murmur, Rub or gallop 6. Lungs: Clear to auscultation bilaterally, no wheezes or crackles   7. Abdomen: Soft, non-tender, Non distended 8. Lower extremities: no clubbing, cyanosis, or edema 9. Neurologically Grossly intact, moving all 4 extremities equally  Is present 10. MSK: Normal range of motion  body mass index is 24.39 kg/(m^2).   Labs on Admission:   Recent Labs  03/11/14 1515  NA 137  K 3.9  CL 97  CO2 26  GLUCOSE 114*  BUN 15  CREATININE 0.73  CALCIUM 10.1   No results found for this basename: AST, ALT, ALKPHOS, BILITOT, PROT, ALBUMIN,  in the last 72 hours No results found for this basename: LIPASE, AMYLASE,  in the last 72 hours  Recent Labs  03/11/14 1515  WBC 8.6  NEUTROABS 6.9  HGB 15.8  HCT 44.7  MCV 86.0  PLT 274   No results found for this basename: CKTOTAL, CKMB, CKMBINDEX, TROPONINI,  in the last 72 hours No results found for this basename: TSH, T4TOTAL, FREET3, T3FREE, THYROIDAB,  in the last 72 hours No results found for this basename: VITAMINB12, FOLATE, FERRITIN, TIBC, IRON, RETICCTPCT,  in the last 72 hours No results found for this basename: HGBA1C    Estimated Creatinine Clearance: 141.9 ml/min (by C-G formula based on Cr of 0.73). ABG No results found for this basename: phart, pco2, po2, hco3, tco2, acidbasedef, o2sat     No results found for this basename: DDIMER     Other results:   UA no evidence of UTI  BNP (last 3 results) No results found for this basename: PROBNP,  in the last 8760 hours  Filed Weights   03/11/14 2124  Weight: 77.111 kg (170 lb)     Cultures:    Component Value Date/Time   SDES CSF 03/11/2014 1830   SPECREQUEST NONE 03/11/2014 1830   CULT NO GROWTH 3 DAYS 05/11/2012 1710   REPTSTATUS 03/11/2014 FINAL 03/11/2014 1830         Radiological Exams on Admission: Ct Head Wo  Contrast  03/11/2014   CLINICAL DATA:  Headaches  EXAM: CT HEAD WITHOUT CONTRAST  TECHNIQUE: Contiguous axial images were obtained from the base of the skull through the vertex without intravenous contrast.  COMPARISON:  None.  FINDINGS: The bony calvarium is intact. The ventricles are of normal size and configuration. No findings to suggest acute hemorrhage, acute infarction or space-occupying mass lesion are noted.  IMPRESSION: No acute intracranial abnormality noted.   Electronically Signed   By: Alcide CleverMark  Lukens M.D.   On: 03/11/2014  15:59    Chart has been reviewed  Assessment/Plan  28 year old gentleman with most likely viral meningitis   Present on Admission:   . Meningitis Will admit to telemetry on droplet precautions. For now continue broad-spectrum antibiotics and the cycler until CSF cultures back. Recommended a consult in the a.m. Obtain HSV and VDRL CSF. HIV antibody.  Prophylaxis: SCD    CODE STATUS:  FULL CODE     Other plan as per orders.  I have spent a total of 55 min on this admission  Elana Jian 03/11/2014, 10:11 PM  [facility]  Pager [phone]   If 7AM-7PM, please contact the day team taking care of the patient  [email or web address]  Password [email or web address]

## 2014-03-11 NOTE — ED Notes (Addendum)
C/o 2 day duration of HA, unable to get relief w usual tylenol, motrin, ice pack; pain "8" on 1-10 scale. Limited cervical ROM

## 2014-03-12 DIAGNOSIS — R112 Nausea with vomiting, unspecified: Secondary | ICD-10-CM

## 2014-03-12 DIAGNOSIS — R291 Meningismus: Secondary | ICD-10-CM

## 2014-03-12 LAB — TSH: TSH: 0.959 u[IU]/mL (ref 0.350–4.500)

## 2014-03-12 LAB — COMPREHENSIVE METABOLIC PANEL
ALK PHOS: 78 U/L (ref 39–117)
ALT: 35 U/L (ref 0–53)
ANION GAP: 14 (ref 5–15)
AST: 21 U/L (ref 0–37)
Albumin: 3.7 g/dL (ref 3.5–5.2)
BUN: 11 mg/dL (ref 6–23)
CO2: 24 mEq/L (ref 19–32)
Calcium: 9.1 mg/dL (ref 8.4–10.5)
Chloride: 101 mEq/L (ref 96–112)
Creatinine, Ser: 0.66 mg/dL (ref 0.50–1.35)
GFR calc Af Amer: >90 mL/min (ref >90)
GFR calc non Af Amer: >90 mL/min (ref >90)
Glucose, Bld: 130 mg/dL — ABNORMAL HIGH (ref 70–99)
POTASSIUM: 3.6 meq/L — AB (ref 3.7–5.3)
SODIUM: 139 meq/L (ref 137–147)
TOTAL PROTEIN: 6.3 g/dL (ref 6.0–8.3)
Total Bilirubin: 1.3 mg/dL — ABNORMAL HIGH (ref 0.3–1.2)

## 2014-03-12 LAB — MAGNESIUM: Magnesium: 1.8 mg/dL (ref 1.5–2.5)

## 2014-03-12 LAB — CBC
HEMATOCRIT: 39.9 % (ref 39.0–52.0)
HEMOGLOBIN: 13.9 g/dL (ref 13.0–17.0)
MCH: 30.1 pg (ref 26.0–34.0)
MCHC: 34.8 g/dL (ref 30.0–36.0)
MCV: 86.4 fL (ref 78.0–100.0)
Platelets: 231 10*3/uL (ref 150–400)
RBC: 4.62 MIL/uL (ref 4.22–5.81)
RDW: 12 % (ref 11.5–15.5)
WBC: 8.3 10*3/uL (ref 4.0–10.5)

## 2014-03-12 LAB — HERPES SIMPLEX VIRUS(HSV) DNA BY PCR
HSV 1 DNA: NOT DETECTED
HSV 2 DNA: NOT DETECTED

## 2014-03-12 LAB — HIV ANTIBODY (ROUTINE TESTING W REFLEX): HIV 1&2 Ab, 4th Generation: NONREACTIVE

## 2014-03-12 LAB — PHOSPHORUS: PHOSPHORUS: 3.1 mg/dL (ref 2.3–4.6)

## 2014-03-12 MED ORDER — SODIUM CHLORIDE 0.9 % IJ SOLN
3.0000 mL | Freq: Two times a day (BID) | INTRAMUSCULAR | Status: DC
  Administered 2014-03-12 – 2014-03-13 (×3): 3 mL via INTRAVENOUS

## 2014-03-12 MED ORDER — ACETAMINOPHEN 325 MG PO TABS: 650.0000 mg | ORAL_TABLET | Freq: Four times a day (QID) | ORAL | Status: DC | PRN

## 2014-03-12 MED ORDER — DOCUSATE SODIUM 100 MG PO CAPS
100.0000 mg | ORAL_CAPSULE | Freq: Two times a day (BID) | ORAL | Status: DC
  Administered 2014-03-12 – 2014-03-13 (×4): 100 mg via ORAL
  Filled 2014-03-12 (×5): qty 1

## 2014-03-12 MED ORDER — NICOTINE 21 MG/24HR TD PT24
21.0000 mg | MEDICATED_PATCH | Freq: Every day | TRANSDERMAL | Status: DC
  Administered 2014-03-12 – 2014-03-13 (×2): 21 mg via TRANSDERMAL
  Filled 2014-03-12 (×2): qty 1

## 2014-03-12 MED ORDER — ACETAMINOPHEN 650 MG RE SUPP: 650.0000 mg | Freq: Four times a day (QID) | RECTAL | Status: DC | PRN

## 2014-03-12 MED ORDER — VANCOMYCIN HCL 1000 MG IV SOLR
1000.0000 mg | Freq: Three times a day (TID) | INTRAVENOUS | Status: DC
  Administered 2014-03-12 – 2014-03-13 (×4): 1000 mg via INTRAVENOUS
  Filled 2014-03-12 (×5): qty 1000

## 2014-03-12 MED ORDER — ONDANSETRON HCL 4 MG/2ML IJ SOLN: 4.0000 mg | Freq: Four times a day (QID) | INTRAMUSCULAR | Status: DC | PRN

## 2014-03-12 MED ORDER — ONDANSETRON HCL 4 MG PO TABS: 4.0000 mg | ORAL_TABLET | Freq: Four times a day (QID) | ORAL | Status: DC | PRN

## 2014-03-12 MED ORDER — HYDROCODONE-ACETAMINOPHEN 5-325 MG PO TABS
2.0000 | ORAL_TABLET | ORAL | Status: DC | PRN
  Administered 2014-03-12 – 2014-03-13 (×7): 2 via ORAL
  Filled 2014-03-12 (×7): qty 2

## 2014-03-12 MED ORDER — METOCLOPRAMIDE HCL 5 MG/ML IJ SOLN
10.0000 mg | Freq: Three times a day (TID) | INTRAMUSCULAR | Status: DC
  Administered 2014-03-12 – 2014-03-13 (×5): 10 mg via INTRAVENOUS
  Filled 2014-03-12 (×8): qty 2

## 2014-03-12 MED ORDER — DEXTROSE 5 % IV SOLN
10.0000 mg/kg | Freq: Three times a day (TID) | INTRAVENOUS | Status: DC
  Administered 2014-03-12: 770 mg via INTRAVENOUS
  Filled 2014-03-12 (×3): qty 15.4

## 2014-03-12 NOTE — Progress Notes (Signed)
[facility] PROGRESS NOTE  Jacob Newton ZOX:[id number] DOB: 02/11/1986 DOA: 03/11/2014 PCP: Roxy MannsMarne Tower, MD  Assessment/Plan: Meningitis Possibly viral with symptoms fo headache, low grade fever and meningismus. Wbc of 37 on CSF, otherwise unremarkable. Placed on empiric vanco , rocephin and acyclovir on admission. Gm stain and  cx so far negative.  appreciate ID recs. recommend to continue antibiotics until cx negative 48 hrs Recommend to d/c acyclovir as no signs of encephalitis Afebrile, headache improving. No nausea. Advance diet. vicodin and prn dialudid for pain   Diet: regular  DVT prophylaxis: SCD  Code Status:  Family Communication: wife at bedside Disposition Plan: home tomorrow if cx negative   Consultants:  ID  Procedures:  LP on 7/4  Antibiotics:  IV vanco and rocephin  HPI/Subjective: Reports headche and neck pain which is better, no nausea or v  Objective: Filed Vitals:   03/12/14 0610  BP: 119/77  Pulse: 74  Temp: 98.1 F (36.7 C)  Resp: 18    Intake/Output Summary (Last 24 hours) at 03/12/14 1207 Last data filed at 03/12/14 [id number]  Gross per 24 hour  Intake 1207.48 ml  Output      0 ml  Net 1207.48 ml   Filed Weights   03/11/14 2124  Weight: 77.111 kg (170 lb)    Exam:   General:  Young male in NAD  HEENT: no pallor, moist mucosa  Cardiovascular: NS1&S2  Respiratory: Clear b/l  Abdomen: soft, NT, ND, BS+  Musculoskeletal: warm, no edmea  CNS: AAOX3, non focal, mild neck rigidity  Data Reviewed: Basic Metabolic Panel:  Recent Labs Lab 03/11/14 1515 03/12/14 0445  NA 137 139  K 3.9 3.6*  CL 97 101  CO2 26 24  GLUCOSE 114* 130*  BUN 15 11  CREATININE 0.73 0.66  CALCIUM 10.1 9.1  MG  --  1.8  PHOS  --  3.1   Liver Function Tests:  Recent Labs Lab 03/12/14 0445  AST 21  ALT 35  ALKPHOS 78  BILITOT 1.3*  PROT 6.3  ALBUMIN 3.7   No results found for this basename: LIPASE, AMYLASE,  in the last  168 hours No results found for this basename: AMMONIA,  in the last 168 hours CBC:  Recent Labs Lab 03/11/14 1515 03/12/14 0445  WBC 8.6 8.3  NEUTROABS 6.9  --   HGB 15.8 13.9  HCT 44.7 39.9  MCV 86.0 86.4  PLT 274 231   Cardiac Enzymes: No results found for this basename: CKTOTAL, CKMB, CKMBINDEX, TROPONINI,  in the last 168 hours BNP (last 3 results) No results found for this basename: PROBNP,  in the last 8760 hours CBG: No results found for this basename: GLUCAP,  in the last 168 hours  Recent Results (from the past 240 hour(s))  CSF CULTURE     Status: None   Collection Time    03/11/14  6:30 PM      Result Value Ref Range Status   Specimen Description CSF   Final   Special Requests NONE   Final   Gram Stain     Final   Value: WBC SEEN NO ORGANISMS SEEN     Gram Stain Report Called to,Read Back By and Verified With: Gram Stain Report Called to,Read Back By and Verified With: POSTEN E AT 2100 ON [id number] BY HOOKER B [facility]formed by [facility]     Performed at [facility]   Culture PENDING   Incomplete   Report Status PENDING  Incomplete  GRAM STAIN     Status: None   Collection Time    03/11/14  6:30 PM      Result Value Ref Range Status   Specimen Description CSF   Final   Special Requests NONE   Final   Gram Stain     Final   Value: WBC SEEN     NO ORGANISMS SEEN     Gram Stain Report Called to,Read Back By and Verified With: POSTEN,E AT 2100 ON [id number] BY HOOKER,B   Report Status 03/11/2014 FINAL   Final     Studies: Ct Head Wo Contrast  03/11/2014   CLINICAL DATA:  Headaches  EXAM: CT HEAD WITHOUT CONTRAST  TECHNIQUE: Contiguous axial images were obtained from the base of the skull through the vertex without intravenous contrast.  COMPARISON:  None.  FINDINGS: The bony calvarium is intact. The ventricles are of normal size and configuration. No findings to suggest acute hemorrhage, acute infarction or space-occupying mass lesion are noted.   IMPRESSION: No acute intracranial abnormality noted.   Electronically Signed   By: Alcide CleverMark  Lukens M.D.   On: 03/11/2014 15:59    Scheduled Meds: . cefTRIAXone (ROCEPHIN)  IV  2 g Intravenous Q12H  . docusate sodium  100 mg Oral BID  . metoCLOPramide (REGLAN) injection  10 mg Intravenous 3 times per day  . nicotine  21 mg Transdermal Daily  . sodium chloride  3 mL Intravenous Q12H  . vancomycin (VANCOCIN) 1000 mg IVPB  1,000 mg Intravenous Q8H   Continuous Infusions:     Time spent: 25 MNUTES    Jacob Newton  [facility] [phone] If 7PM-7AM, please contact night-coverage at [email or web address], password [email or web address] 03/12/2014, 12:07 PM  LOS: 1 day

## 2014-03-12 NOTE — Progress Notes (Signed)
[facility]     Reason for Consult:meningitis    Referring Physician: Dr. Gonzella Lexhungel  Active Problems:   BACK PAIN, CHRONIC   Meningitis   . cefTRIAXone (ROCEPHIN)  IV  2 g Intravenous Q12H  . docusate sodium  100 mg Oral BID  . metoCLOPramide (REGLAN) injection  10 mg Intravenous 3 times per day  . nicotine  21 mg Transdermal Daily  . sodium chloride  3 mL Intravenous Q12H  . vancomycin (VANCOCIN) 1000 mg IVPB  1,000 mg Intravenous Q8H    Recommendations: Can stop isolation, gram stain negative  Can stop acyclovir, not c/w encephalitis Continue with vancomycin and ceftriaxone until culture negative at 48 hours  If culture remains negative after 48 hours, can stop antibiotics and d/c from an ID standpoint  Assessment: He has signs of viral meningitis with photophobia, N/V, meningismus.  His CSF does show neutrophils so would prefer to r/o bacterial origin caught early by watching the culture.     Antibiotics: Vancomycin and ceftriaxone   HPI: Jacob Newton is a 28 y.o. male with history of chronic back pain from lumbar fracture who presented with 2 days of above symptoms.  No previous history of meningitis.  + fever, neck pain with flexion.  No sick contacts.  No recent travel.     Review of Systems: A comprehensive review of systems was negative.  Past Medical History  Diagnosis Date  . Back pain     chronic low back pain, with L%-S1 stenosis/bulge sisc/arthropathy  . Closed lumbar fx w/ cord inj 2010    hx lumbar fx  . Reactive airways dysfunction syndrome     with illnesses    History  Substance Use Topics  . Smoking status: Current Every Day Smoker -- 0.50 packs/day    Types: Cigarettes  . Smokeless tobacco: Never Used  . Alcohol Use: Yes     Comment: rarely on holidays    Family History  Problem Relation Age of Onset  . Hypertension Mother    Allergies  Allergen Reactions  . Chantix [Varenicline Tartrate] Other (See  Comments)    Bad dreams    OBJECTIVE: Blood pressure 119/77, pulse 74, temperature 98.1 F (36.7 C), temperature source Oral, resp. rate 18, height 5\' 10"  (1.778 m), weight 170 lb (77.111 kg), SpO2 100.00%. General: awake, alert, nad Skin: no rashes, multiple tattoos Lungs: CTA B Cor: RRR Abdomen: soft, nt Ext: no edema Neuro: + pain with neck flexion  Microbiology: Recent Results (from the past 240 hour(s))  CSF CULTURE     Status: None   Collection Time    03/11/14  6:30 PM      Result Value Ref Range Status   Specimen Description CSF   Final   Special Requests NONE   Final   Gram Stain     Final   Value: WBC SEEN NO ORGANISMS SEEN     Gram Stain Report Called to,Read Back By and Verified With: Gram Stain Report Called to,Read Back By and Verified With: POSTEN E AT 2100 ON [id number] BY HOOKER B Performed by [facility]     [facility]formed at [facility]   Culture PENDING   Incomplete   Report Status PENDING   Incomplete  GRAM STAIN     Status: None   Collection Time    03/11/14  6:30 PM      Result Value Ref Range Status   Specimen Description CSF  Final   Special Requests NONE   Final   Gram Stain     Final   Value: WBC SEEN     NO ORGANISMS SEEN     Gram Stain Report Called to,Read Back By and Verified With: POSTEN,E AT 2100 ON [id number] BY HOOKER,B   Report Status 03/11/2014 FINAL   Final    Staci RighterOMER, Alim Cattell, MD [facility] [email or web address] [phone] pager  [phone] cell 03/12/2014, 10:34 AM

## 2014-03-12 NOTE — ED Provider Notes (Signed)
  This was a shared visit with a mid-level provided (NP or PA).  Throughout the patient's course I was available for consultation/collaboration.  I saw the ECG (if appropriate), relevant labs and studies - I agree with the interpretation.  On my exam the patient was quite uncomfortable appearing. Given the patient's history of headaches, neck pain, and subjective fever, meningitis was initial consideration. The development of headache for several days, the absence of focal neurologic deficits suggested against subarachnoid etiology.  Patient had lumbar puncture, completed successfully, by the physician assistant, and myself - I was present for the duration of the procedure, assisting with the positioning and supervising the entire event.  Lumbar puncture results consistent with viral meningitis.  Absent fever, leukocytosis, there is low suspicion for bacterial etiology, but the patient required initiation of therapy, was admitted for further evaluation and management given his meningitis, ongoing pain to      Gerhard Munchobert Candid Bovey, MD 03/12/14 1644

## 2014-03-12 NOTE — ED Provider Notes (Signed)
Medical screening examination/treatment/procedure(s) were performed by non-physician practitioner and as supervising physician I was immediately available for consultation/collaboration.  Leslee Homeavid Ikeya Brockel, M.D.  Reuben Likesavid C Kamyah Wilhelmsen, MD 03/12/14 [phone]

## 2014-03-12 NOTE — Progress Notes (Signed)
ANTIBIOTIC CONSULT NOTE - INITIAL  Pharmacy Consult for Acyclovir Indication: R/o Meningtitis  Allergies  Allergen Reactions  . Chantix [Varenicline Tartrate] Other (See Comments)    Bad dreams    Patient Measurements: Height: 5\' 10"  (177.8 cm) Weight: 170 lb (77.111 kg) IBW/kg (Calculated) : 73   Vital Signs: Temp: 99.1 F (37.3 C) (07/05 0019) Temp src: Oral (07/05 0019) BP: 131/76 mmHg (07/05 0019) Pulse Rate: 78 (07/05 0019) Intake/Output from previous day:   Intake/Output from this shift:    Labs:  Recent Labs  03/11/14 1515  WBC 8.6  HGB 15.8  PLT 274  CREATININE 0.73   Estimated Creatinine Clearance: 141.9 ml/min (by C-G formula based on Cr of 0.73). No results found for this basename: VANCOTROUGH, VANCOPEAK, VANCORANDOM, GENTTROUGH, GENTPEAK, GENTRANDOM, TOBRATROUGH, TOBRAPEAK, TOBRARND, AMIKACINPEAK, AMIKACINTROU, AMIKACIN,  in the last 72 hours   Microbiology: Recent Results (from the past 720 hour(s))  GRAM STAIN     Status: None   Collection Time    03/11/14  6:30 PM      Result Value Ref Range Status   Specimen Description CSF   Final   Special Requests NONE   Final   Gram Stain     Final   Value: WBC SEEN     NO ORGANISMS SEEN     Gram Stain Report Called to,Read Back By and Verified With: POSTEN,E AT 2100 ON [id number] BY HOOKER,B   Report Status 03/11/2014 FINAL   Final    Medical History: Past Medical History  Diagnosis Date  . Back pain     chronic low back pain, with L%-S1 stenosis/bulge sisc/arthropathy  . Closed lumbar fx w/ cord inj 2010    hx lumbar fx  . Reactive airways dysfunction syndrome     with illnesses    Medications:  Scheduled:  . acyclovir  10 mg/kg Intravenous 3 times per day  . cefTRIAXone (ROCEPHIN)  IV  2 g Intravenous Q12H  . docusate sodium  100 mg Oral BID  . metoCLOPramide (REGLAN) injection  10 mg Intravenous 3 times per day  . sodium chloride  3 mL Intravenous Q12H  . vancomycin  2,000 mg Intravenous  Once  . vancomycin  1,000 mg Intravenous Q8H   Infusions:  . sodium chloride 125 mL/hr at 03/11/14 2335   Assessment: 28 yo c/o HA, vomiting x 2 days.  Pt known to pharmacy from earlier Rocephin and Vancomycin consult.  Pharmacy asked to provide dosing of Acyclovir for r/o meningitis.    Received Acyclovir 770mg  x 1 in ED @ 21:56 on 7/4   Goal of Therapy:  Eradication of infection  Plan:   Acyclovir 770mg  (10mg /kg) IV q8h  F/U SCr/levels/cultures  Librado Guandique, Joselyn GlassmanLeann Trefz, PharmD 03/12/2014,12:34 AM

## 2014-03-13 DIAGNOSIS — A879 Viral meningitis, unspecified: Secondary | ICD-10-CM | POA: Diagnosis present

## 2014-03-13 DIAGNOSIS — F17200 Nicotine dependence, unspecified, uncomplicated: Secondary | ICD-10-CM

## 2014-03-13 LAB — HERPES SIMPLEX VIRUS CULTURE: Culture: NOT DETECTED

## 2014-03-13 MED ORDER — HYDROCODONE-ACETAMINOPHEN 5-325 MG PO TABS: 2.0000 | ORAL_TABLET | ORAL | Status: DC | PRN

## 2014-03-13 MED ORDER — NICOTINE 21 MG/24HR TD PT24: 21.0000 mg | MEDICATED_PATCH | Freq: Every day | TRANSDERMAL | Status: DC

## 2014-03-13 NOTE — Discharge Instructions (Signed)
Viral Meningitis Meningitis is an inflammation of the fluid and membranes surrounding the spinal cord and brain. Viral meningitis is caused by a viral infection. It is important to know whether a virus or bacterium is causing your meningitis. Viral meningitis is generally less severe than bacterial meningitis. Aseptic meningitis is any meningitis not caused by a bacterial infection, including viral meningitis. Meningitis can also be caused by fungi, parasites, certain medicines, cancer, and autoimmune disorders. Uncomplicated meningitis usually resolves on its own in 7 to 10 days.However, there can be complicated cases that last much longer. People with lowered immune systems are at greater risk for poor outcomes from meningitis.It is important to get treatment as soon as possible to minimize the impact of a meningitis infection. Long-term complications could include seizures, hearing loss, weakness, paralysis, blindness, or cognitive impairment. CAUSES Most cases of meningitis are due to viruses. Viruses that cause meningitis include enteroviruses (such as polio and coxsackie viruses), herpes simplex virus, varicella zoster virus, mumps, and HIV. SYMPTOMS  Symptoms can develop over many hours. They may even take a few days to develop. Common symptoms of meningitis in people over the age of 2 years include:  High fever.  Headache.  Stiff neck.  Irritability.  Nausea and vomiting.  Fatigue.  Discomfort from exposure to light.  Discomfort from exposure to loud noise.  Trouble walking.  Altered mental status.  Seizures. DIAGNOSIS  Early diagnosis and treatment are very important. If you have symptoms, you should see your caregiver right away. Your evaluation may include lab tests of your blood and spinal fluid. A spinal fluid sample is taken through a procedure called a lumbar puncture or spinal tap. During the procedure, a needle is inserted into an area in the lower back. You may also  have a CT scan of your brain as part of your evaluation.If there is suspicion of an infection or inflammation of the brain (encephalitis), an MRI scan may be done. TREATMENT   Antibiotics are not effective against a virus. Antiviral medicines may be used depending on the cause of your meningitis.  Treatment for viral meningitis focuses on reducing your symptoms. This may include rest, hydration, and medicines to reduce fever and pain.  Steroids may also be used if there is significant swelling of the brain. PREVENTION  Vaccines can help prevent meningitis caused by polio, measles, and mumps.  Strict hand washing is effective in controlling the spread of many causes of meningitis.  Using barrier protection during sexual intercourse can prevent the spread of meningitis caused by viruses such as the herpes virus or HIV.  Protection from mosquitoes, especially for the very young and very old, can prevent some causes of meningitis. HOME CARE INSTRUCTIONS  Rest.  Drink enough water and fluids to keep your urine clear or pale yellow.  Take all medicine as prescribed.  Practice good hygiene to prevent others from getting sick.  Follow up with your caregiver as directed. SEEK IMMEDIATE MEDICAL CARE IF:  You develop dizziness.  You have a very fast heartbeat.  You have difficulty breathing.  You develop confusion.  You have seizures.  You have unstoppable nausea and vomiting.  You develop any worsening symptoms. MAKE SURE YOU:  Understand these instructions.  Will watch your condition.  Will get help right away if you are not doing well or get worse. Document Released: 08/15/2002 Document Revised: 11/17/2011 Document Reviewed: 12/10/2010 ExitCare Patient Information 2015 [employer]. This information is not intended to replace advice given to you   by your health care provider. Make sure you discuss any questions you have with your health care provider.  

## 2014-03-13 NOTE — Discharge Summary (Addendum)
Physician Discharge Summary  Jacob Newton YQM:[id number] DOB: 01/30/1986 DOA: 03/11/2014  PCP: Roxy MannsMarne Tower, MD  Admit date: 03/11/2014 Discharge date: 03/13/2014  Time spent:30 minutes  Recommendations for Outpatient Follow-up:  1. Discharge home with outpatient PCP followup  Discharge Diagnoses:   Principal problem Viral meningitis  Active Problems:   Tobacco use disorder    Discharge Condition: Fair  Diet recommendation: Regular  Filed Weights   03/11/14 2124  Weight: 77.111 kg (170 lb)    History of present illness:  28 year old male with history of tobacco use, chronic back pain, ADHD presented with two-day history of worsening headache with neck stiffness and no meningismus. He was seen in the urgent care and was sent to the ED. In the ED and NP was done which showed WBC of 37 with, protein, glucose and negative Gram stain. He was started on empiric vancomycin, ceftriaxone and acyclovir and admitted to medical floor.  Hospital Course:  Meningitis  Possibly viral with symptoms of  headache, low grade fever and meningismus. Wbc of 37 on CSF, otherwise unremarkable. Placed on empiric vanco , rocephin and acyclovir on admission.  Gm stain and cx so far negative.  appreciate ID recs. recommend to continue antibiotics until cx negative 48 hrs . -Discontinued acyclovir as no signs of encephalitis  -Patient remains Afebrile, headache improving. No nausea. Diet advanced. -Patient is clinically stable and will discharge him on oral Vicodin prn for headache. He may resume ibuprofen when necessary.  Tobacco use Counseled on smoking cessation. Prescribe nicotine patch.  Patient is clinically stable for discharge and to follow up with his PCP in one week. Patient instructed to hydrate himself with plenty of water and seek immediate medical attention if he has worsening headache, nausea and vomiting, focal weakness, change in mental status or seizures.  Diet: regular   Code  Status: Full code Family Communication: wife at bedside  Disposition Plan: home    Consultants:  ID Procedures:  LP on 7/4 Antibiotics:  IV vanco and rocephin     Procedures:  Lumbar puncture  Consultations:  Infectious disease ([email or web address])  Discharge Exam: Filed Vitals:   03/13/14 0527  BP: 124/82  Pulse: 63  Temp: 97.5 F (36.4 C)  Resp: 16    General: Young male in NAD  HEENT: no pallor, moist mucosa  Cardiovascular: NS1&S2  Respiratory: Clear b/l  Abdomen: soft, NT, ND, BS+  Musculoskeletal: warm, no edmea  CNS: AAOX3, non focal, no neck rigidity   Discharge Instructions You were cared for by a hospitalist during your hospital stay. If you have any questions about your discharge medications or the care you received while you were in the hospital after you are discharged, you can call the unit and asked to speak with the hospitalist on call if the hospitalist that took care of you is not available. Once you are discharged, your primary care physician will handle any further medical issues. Please note that NO REFILLS for any discharge medications will be authorized once you are discharged, as it is imperative that you return to your primary care physician (or establish a relationship with a primary care physician if you do not have one) for your aftercare needs so that they can reassess your need for medications and monitor your lab values.     Medication List         aspirin-acetaminophen-caffeine 250-250-65 MG per tablet  Commonly known as:  EXCEDRIN MIGRAINE  Take 2 tablets by mouth every 6 (six) hours as  needed for headache.     HYDROcodone-acetaminophen 5-325 MG per tablet  Commonly known as:  NORCO/VICODIN  Take 2 tablets by mouth every 4 (four) hours as needed for moderate pain.     ibuprofen 200 MG tablet  Commonly known as:  ADVIL,MOTRIN  Take 600 mg by mouth every 6 (six) hours as needed for headache or moderate pain.     nicotine 21 mg/24hr  patch  Commonly known as:  NICODERM CQ - dosed in mg/24 hours  Place 1 patch (21 mg total) onto the skin daily.       Allergies  Allergen Reactions  . Chantix [Varenicline Tartrate] Other (See Comments)    Bad dreams       Follow-up Information   Follow up with Roxy MannsMarne Tower, MD. Schedule an appointment as soon as possible for a visit in 1 week.   Specialties:  Family Medicine, Radiology   Contact information:   [address] [place] [address] [place] [id number] [phone]        The results of significant diagnostics from this hospitalization (including imaging, microbiology, ancillary and laboratory) are listed below for reference.    Significant Diagnostic Studies: Ct Head Wo Contrast  03/11/2014   CLINICAL DATA:  Headaches  EXAM: CT HEAD WITHOUT CONTRAST  TECHNIQUE: Contiguous axial images were obtained from the base of the skull through the vertex without intravenous contrast.  COMPARISON:  None.  FINDINGS: The bony calvarium is intact. The ventricles are of normal size and configuration. No findings to suggest acute hemorrhage, acute infarction or space-occupying mass lesion are noted.  IMPRESSION: No acute intracranial abnormality noted.   Electronically Signed   By: Alcide CleverMark  Lukens M.D.   On: 03/11/2014 15:59    Microbiology: Recent Results (from the past 240 hour(s))  CSF CULTURE     Status: None   Collection Time    03/11/14  6:30 PM      Result Value Ref Range Status   Specimen Description CSF   Final   Special Requests NONE   Final   Gram Stain     Final   Value: WBC SEEN NO ORGANISMS SEEN     Gram Stain Report Called to,Read Back By and Verified With: Gram Stain Report Called to,Read Back By and Verified With: POSTEN E AT 2100 ON [id number] BY HOOKER B [facility]     [facility] at [facility]   Culture     Final   Value: NO GROWTH     Performed at [facility]   Report Status PENDING   Incomplete  GRAM STAIN      Status: None   Collection Time    03/11/14  6:30 PM      Result Value Ref Range Status   Specimen Description CSF   Final   Special Requests NONE   Final   Gram Stain     Final   Value: WBC SEEN     NO ORGANISMS SEEN     Gram Stain Report Called to,Read Back By and Verified With: POSTEN,E AT 2100 ON [id number] BY HOOKER,B   Report Status 03/11/2014 FINAL   Final  HERPES SIMPLEX VIRUS CULTURE     Status: None   Collection Time    03/11/14  7:01 PM      Result Value Ref Range Status   Specimen Description NASOPHARYNGEAL   Final   Special Requests NONE   Final   Culture  Final   Value: Culture has been initiated.     Performed at [facility]   Report Status PENDING   Incomplete     Labs: Basic Metabolic Panel:  Recent Labs Lab 03/11/14 1515 03/12/14 0445  NA 137 139  K 3.9 3.6*  CL 97 101  CO2 26 24  GLUCOSE 114* 130*  BUN 15 11  CREATININE 0.73 0.66  CALCIUM 10.1 9.1  MG  --  1.8  PHOS  --  3.1   Liver Function Tests:  Recent Labs Lab 03/12/14 0445  AST 21  ALT 35  ALKPHOS 78  BILITOT 1.3*  PROT 6.3  ALBUMIN 3.7   No results found for this basename: LIPASE, AMYLASE,  in the last 168 hours No results found for this basename: AMMONIA,  in the last 168 hours CBC:  Recent Labs Lab 03/11/14 1515 03/12/14 0445  WBC 8.6 8.3  NEUTROABS 6.9  --   HGB 15.8 13.9  HCT 44.7 39.9  MCV 86.0 86.4  PLT 274 231   Cardiac Enzymes: No results found for this basename: CKTOTAL, CKMB, CKMBINDEX, TROPONINI,  in the last 168 hours BNP: BNP (last 3 results) No results found for this basename: PROBNP,  in the last 8760 hours CBG: No results found for this basename: GLUCAP,  in the last 168 hours     Signed:  Axzel Rockhill  [facility] 03/13/2014, 11:08 AM

## 2014-03-14 ENCOUNTER — Telehealth: Payer: Self-pay | Admitting: Family Medicine

## 2014-03-14 NOTE — Telephone Encounter (Signed)
Transitional Care Call attempted.  Left vm for pt to return call. 

## 2014-03-14 NOTE — Telephone Encounter (Signed)
Transitional Care Call.  [facility].  D/C dx: Viral meningitis.  Spoke to pt's wife.  She said that pt is doing better today.  He is still having intermittent HA's as the Vicodin wears off but she understands that is to be expected.  Nausea has subsided and pt has normal appetite.  Afebrile.  Patient lives at home with his wife who can assist him as needed.  He is able to ambulate and perform ADLs independently.  She would like to know if Dr. Milinda Antisower can start him on Gabapentin for his back pain as she has had a back injury herself and this seems to help her.  Denies any other questions for Dr. Milinda Antisower.  Denies questions r/t d/c instructions or med list.  Hospital follow up scheduled for 03/20/14.

## 2014-03-15 LAB — CSF CULTURE: CULTURE: NO GROWTH

## 2014-03-15 LAB — CSF CULTURE W GRAM STAIN

## 2014-03-16 LAB — VDRL, CSF: SYPHILIS VDRL QUANT CSF: NONREACTIVE

## 2014-03-20 ENCOUNTER — Encounter: Payer: Self-pay | Admitting: Family Medicine

## 2014-03-20 ENCOUNTER — Ambulatory Visit (INDEPENDENT_AMBULATORY_CARE_PROVIDER_SITE_OTHER): Payer: Managed Care, Other (non HMO) | Admitting: Family Medicine

## 2014-03-20 VITALS — BP 124/72 | HR 106 | Temp 98.4°F | Ht 65.5 in | Wt 160.2 lb

## 2014-03-20 DIAGNOSIS — F17200 Nicotine dependence, unspecified, uncomplicated: Secondary | ICD-10-CM

## 2014-03-20 DIAGNOSIS — M549 Dorsalgia, unspecified: Secondary | ICD-10-CM

## 2014-03-20 DIAGNOSIS — A879 Viral meningitis, unspecified: Secondary | ICD-10-CM

## 2014-03-20 MED ORDER — GABAPENTIN 100 MG PO CAPS: 100.0000 mg | ORAL_CAPSULE | Freq: Two times a day (BID) | ORAL | Status: DC

## 2014-03-20 NOTE — Progress Notes (Signed)
Subjective:    Patient ID: Jacob Newton, male    DOB: 08/07/1986, 28 y.o.   MRN: [id number]  HPI Here for f/u hosp 7/4-7/6  Dx with viral meningitis  Dr Luciana Axe saw him in the hospital   Was originally put him on vanco and ceftiraxone and acyclovir -then taken off vicodine for pain   Wt is down 10 lb - he is getting his appetite back   Feels a lot better the last 2 days overall  Is back to work and able to handle a full day   Still smoking --  Is using patches to quit however (1/2ppd smoker right now)   No headache in the past 2 days   Wife was interested in gabapentin for his back  Has never been on it  Has chronic back pain - hurts the most in lower back -- all the time  Takes vicodin for this     Results for orders placed during the hospital encounter of 03/11/14  CSF CULTURE      Result Value Ref Range   Specimen Description CSF     Special Requests NONE     Gram Stain       Value: WBC SEEN NO ORGANISMS SEEN     Gram Stain Report Called to,Read Back By and Verified With: Gram Stain Report Called to,Read Back By and Verified With: POSTEN E AT 2100 ON [id number] BY HOOKER B Performed by [facility]     Performed at [facility]   Culture       Value: NO GROWTH 3 DAYS     Performed at [facility]   Report Status 03/15/2014 FINAL    GRAM STAIN      Result Value Ref Range   Specimen Description CSF     Special Requests NONE     Gram Stain       Value: WBC SEEN     NO ORGANISMS SEEN     Gram Stain Report Called to,Read Back By and Verified With: POSTEN,E AT 2100 ON [id number] BY HOOKER,B   Report Status 03/11/2014 FINAL    HERPES SIMPLEX VIRUS CULTURE      Result Value Ref Range   Specimen Description NASOPHARYNGEAL     Special Requests NONE     Culture       Value: No Herpes Simplex Virus detected.     Performed at [facility]   Report Status 03/13/2014 FINAL    CBC WITH DIFFERENTIAL      Result Value Ref Range   WBC 8.6  4.0  - 10.5 K/uL   RBC 5.20  4.22 - 5.81 MIL/uL   Hemoglobin 15.8  13.0 - 17.0 g/dL   HCT 95.6  21.3 - 08.6 %   MCV 86.0  78.0 - 100.0 fL   MCH 30.4  26.0 - 34.0 pg   MCHC 35.3  30.0 - 36.0 g/dL   RDW 57.8  46.9 - 62.9 %   Platelets 274  150 - 400 K/uL   Neutrophils Relative % 79 (*) 43 - 77 %   Neutro Abs 6.9  1.7 - 7.7 K/uL   Lymphocytes Relative 14  12 - 46 %   Lymphs Abs 1.2  0.7 - 4.0 K/uL   Monocytes Relative 7  3 - 12 %   Monocytes Absolute 0.6  0.1 - 1.0 K/uL   Eosinophils Relative 0  0 - 5 %   Eosinophils Absolute 0.0  0.0 - 0.7 K/uL   Basophils Relative 0  0 - 1 %   Basophils Absolute 0.0  0.0 - 0.1 K/uL  BASIC METABOLIC PANEL      Result Value Ref Range   Sodium 137  137 - 147 mEq/L   Potassium 3.9  3.7 - 5.3 mEq/L   Chloride 97  96 - 112 mEq/L   CO2 26  19 - 32 mEq/L   Glucose, Bld 114 (*) 70 - 99 mg/dL   BUN 15  6 - 23 mg/dL   Creatinine, Ser 1.19  0.50 - 1.35 mg/dL   Calcium 14.7  8.4 - 82.9 mg/dL   GFR calc non Af Amer >90  >90 mL/min   GFR calc Af Amer >90  >90 mL/min   Anion gap 14  5 - 15  URINALYSIS, ROUTINE W REFLEX MICROSCOPIC      Result Value Ref Range   Color, Urine AMBER (*) YELLOW   APPearance CLEAR  CLEAR   Specific Gravity, Urine 1.030  1.005 - 1.030   pH 6.0  5.0 - 8.0   Glucose, UA NEGATIVE  NEGATIVE mg/dL   Hgb urine dipstick NEGATIVE  NEGATIVE   Bilirubin Urine NEGATIVE  NEGATIVE   Ketones, ur 40 (*) NEGATIVE mg/dL   Protein, ur NEGATIVE  NEGATIVE mg/dL   Urobilinogen, UA 1.0  0.0 - 1.0 mg/dL   Nitrite NEGATIVE  NEGATIVE   Leukocytes, UA NEGATIVE  NEGATIVE  CSF CELL COUNT WITH DIFFERENTIAL      Result Value Ref Range   Tube # 1     Color, CSF COLORLESS  COLORLESS   Appearance, CSF CLEAR  CLEAR   Supernatant NOT INDICATED     RBC Count, CSF 53 (*) 0 /cu mm   WBC, CSF 21 (*) 0 - 5 /cu mm   Segmented Neutrophils-CSF 77 (*) 0 - 6 %   Lymphs, CSF 13 (*) 40 - 80 %   Monocyte-Macrophage-Spinal Fluid 10 (*) 15 - 45 %   Eosinophils, CSF 0  0  - 1 %   Other Cells, CSF 0    CSF CELL COUNT WITH DIFFERENTIAL      Result Value Ref Range   Tube # 4     Color, CSF COLORLESS  COLORLESS   Appearance, CSF CLEAR  CLEAR   Supernatant NOT INDICATED     RBC Count, CSF 2 (*) 0 /cu mm   WBC, CSF 37 (*) 0 - 5 /cu mm   Segmented Neutrophils-CSF 67 (*) 0 - 6 %   Lymphs, CSF 21 (*) 40 - 80 %   Monocyte-Macrophage-Spinal Fluid 12 (*) 15 - 45 %   Eosinophils, CSF 0  0 - 1 %   Other Cells, CSF 0    GLUCOSE, CSF      Result Value Ref Range   Glucose, CSF 73  43 - 76 mg/dL  PROTEIN, CSF      Result Value Ref Range   Total  Protein, CSF 103 (*) 15 - 45 mg/dL  MAGNESIUM      Result Value Ref Range   Magnesium 1.8  1.5 - 2.5 mg/dL  PHOSPHORUS      Result Value Ref Range   Phosphorus 3.1  2.3 - 4.6 mg/dL  TSH      Result Value Ref Range   TSH 0.959  0.350 - 4.500 uIU/mL  COMPREHENSIVE METABOLIC PANEL      Result Value Ref Range   Sodium 139  137 -  147 mEq/L   Potassium 3.6 (*) 3.7 - 5.3 mEq/L   Chloride 101  96 - 112 mEq/L   CO2 24  19 - 32 mEq/L   Glucose, Bld 130 (*) 70 - 99 mg/dL   BUN 11  6 - 23 mg/dL   Creatinine, Ser 9.60  0.50 - 1.35 mg/dL   Calcium 9.1  8.4 - 45.4 mg/dL   Total Protein 6.3  6.0 - 8.3 g/dL   Albumin 3.7  3.5 - 5.2 g/dL   AST 21  0 - 37 U/L   ALT 35  0 - 53 U/L   Alkaline Phosphatase 78  39 - 117 U/L   Total Bilirubin 1.3 (*) 0.3 - 1.2 mg/dL   GFR calc non Af Amer >90  >90 mL/min   GFR calc Af Amer >90  >90 mL/min   Anion gap 14  5 - 15  CBC      Result Value Ref Range   WBC 8.3  4.0 - 10.5 K/uL   RBC 4.62  4.22 - 5.81 MIL/uL   Hemoglobin 13.9  13.0 - 17.0 g/dL   HCT 09.8  11.9 - 14.7 %   MCV 86.4  78.0 - 100.0 fL   MCH 30.1  26.0 - 34.0 pg   MCHC 34.8  30.0 - 36.0 g/dL   RDW 82.9  56.2 - 13.0 %   Platelets 231  150 - 400 K/uL  HIV ANTIBODY (ROUTINE TESTING)      Result Value Ref Range   HIV 1&2 Ab, 4th Generation NONREACTIVE  NONREACTIVE  VDRL, CSF      Result Value Ref Range   VDRL Quant, CSF  Nonreactive  Nonreactive  HERPES SIMPLEX VIRUS(HSV) DNA BY PCR      Result Value Ref Range   Specimen source hsv CSF     HSV 1 DNA Not Detected  Not Detected   HSV 2 DNA Not Detected  Not Detected    Patient Active Problem List   Diagnosis Date Noted  . Meningitis, viral 03/13/2014  . Adverse effects of medication 09/05/2013  . ADD (attention deficit disorder) 06/28/2012  . Poor concentration 02/23/2012  . Routine general medical examination at a health care facility 02/12/2012  . Smoker 08/22/2011  . BACK PAIN, CHRONIC 02/07/2010   Past Medical History  Diagnosis Date  . Back pain     chronic low back pain, with L%-S1 stenosis/bulge sisc/arthropathy  . Closed lumbar fx w/ cord inj 2010    hx lumbar fx  . Reactive airways dysfunction syndrome     with illnesses   Past Surgical History  Procedure Laterality Date  . Back surgery     History  Substance Use Topics  . Smoking status: Current Every Day Smoker -- 0.50 packs/day    Types: Cigarettes  . Smokeless tobacco: Never Used  . Alcohol Use: Yes     Comment: rarely on holidays   Family History  Problem Relation Age of Onset  . Hypertension Mother    Allergies  Allergen Reactions  . Chantix [Varenicline Tartrate] Other (See Comments)    Bad dreams   Current Outpatient Prescriptions on File Prior to Visit  Medication Sig Dispense Refill  . aspirin-acetaminophen-caffeine (EXCEDRIN MIGRAINE) 250-250-65 MG per tablet Take 2 tablets by mouth every 6 (six) hours as needed for headache.      Marland Kitchen HYDROcodone-acetaminophen (NORCO/VICODIN) 5-325 MG per tablet Take 2 tablets by mouth every 4 (four) hours as needed for moderate pain.  30  tablet  0  . ibuprofen (ADVIL,MOTRIN) 200 MG tablet Take 600 mg by mouth every 6 (six) hours as needed for headache or moderate pain.      . nicotine (NICODERM CQ - DOSED IN MG/24 HOURS) 21 mg/24hr patch Place 1 patch (21 mg total) onto the skin daily.  28 patch  0   No current  facility-administered medications on file prior to visit.     Review of Systems    Review of Systems  Constitutional: Negative for fever, appetite change, fatigue and unexpected weight change.  Eyes: Negative for pain and visual disturbance.  Respiratory: Negative for cough and shortness of breath.   Cardiovascular: Negative for cp or palpitations    Gastrointestinal: Negative for nausea, diarrhea and constipation.  Genitourinary: Negative for urgency and frequency.  Skin: Negative for pallor or rash  MSK pos for chronic low back pain worse on the R  Neurological: Negative for weakness, light-headedness, numbness and pos for headaches post hosp that are now gone .  Hematological: Negative for adenopathy. Does not bruise/bleed easily.  Psychiatric/Behavioral: Negative for dysphoric mood. The patient is not nervous/anxious.      Objective:   Physical Exam  Constitutional: He appears well-developed and well-nourished. No distress.  HENT:  Head: Normocephalic and atraumatic.  Mouth/Throat: Oropharynx is clear and moist.  Eyes: Conjunctivae and EOM are normal. Pupils are equal, round, and reactive to light. Right eye exhibits no discharge. Left eye exhibits no discharge. No scleral icterus.  No nystagmus  Neck: Normal range of motion. Neck supple. No JVD present. Carotid bruit is not present. No thyromegaly present.  Cardiovascular: Normal rate, regular rhythm and intact distal pulses.  Exam reveals no gallop.   Pulmonary/Chest: Effort normal and breath sounds normal. No respiratory distress. He has no wheezes. He has no rales.  Abdominal: Soft. Bowel sounds are normal.  Musculoskeletal: He exhibits no edema.  Lymphadenopathy:    He has no cervical adenopathy.  Neurological: He is alert. He has normal reflexes. He displays no tremor. No cranial nerve deficit or sensory deficit. He exhibits normal muscle tone. Coordination and gait normal.  No cerebellar signs  Skin: Skin is warm and  dry. No rash noted. No pallor.  Psychiatric: He has a normal mood and affect.          Assessment & Plan:   Problem List Items Addressed This Visit     Nervous and Auditory   Meningitis, viral - Primary     Much improved after d/c from hospital No headache in 2 d and back to full work duty  Rev hosp records and studies and results in detail  vicodin already on med list for chronic back pain 10 lb wt loss-appetite has returned now  Will continue to updat       Other   BACK PAIN, CHRONIC     Pt is on chronic narcotic (vicodin) since [facility]  Has been compliant with refills etc  Wants to try gabapentin for neurol pain - will start with 100 mg bid - and slowly titrate based on feedback  Disc pot side eff of sedation and dizziness in detail      Smoker     Pt is using patches and smoking about 1/2 ppd (when not wearing patch)- has cut down Not ready to totally quit yet-enc him to set a quit date Disc in detail risks of smoking and possible outcomes including copd, vascular/ heart disease, cancer , respiratory and sinus  infections  Pt voices understanding

## 2014-03-20 NOTE — Assessment & Plan Note (Signed)
Much improved after d/c from hospital No headache in 2 d and back to full work duty  Rev hosp records and studies and results in detail  vicodin already on med list for chronic back pain 10 lb wt loss-appetite has returned now  Will continue to [employer]

## 2014-03-20 NOTE — Patient Instructions (Signed)
Try gabapentin for back pain  Start with 1 pill at bedtime (100 mg) and one pill in the am  In the next 2 weeks-update me with how you are doing and we can titrate it up as tolerated  If any meningitis symptoms return please let us know -I am glad you are doing better

## 2014-03-20 NOTE — Assessment & Plan Note (Signed)
Pt is using patches and smoking about 1/2 ppd (when not wearing patch)- has cut down Not ready to totally quit yet-enc him to set a quit date Disc in detail risks of smoking and possible outcomes including copd, vascular/ heart disease, cancer , respiratory and sinus infections  Pt voices understanding

## 2014-03-20 NOTE — Progress Notes (Signed)
Pre visit review using our clinic review tool, if applicable. No additional management support is needed unless otherwise documented below in the visit note. 

## 2014-03-20 NOTE — Assessment & Plan Note (Signed)
Pt is on chronic narcotic (vicodin) since transferring [facility]  Has been compliant with refills etc  Wants to try gabapentin for neurol pain - will start with 100 mg bid - and slowly titrate based on feedback  Disc pot side eff of sedation and dizziness in detail

## 2014-03-21 ENCOUNTER — Other Ambulatory Visit: Payer: Self-pay

## 2014-03-21 ENCOUNTER — Telehealth: Payer: Self-pay | Admitting: Family Medicine

## 2014-03-21 MED ORDER — HYDROCODONE-ACETAMINOPHEN 5-325 MG PO TABS: 2.0000 | ORAL_TABLET | ORAL | Status: DC | PRN

## 2014-03-21 NOTE — Telephone Encounter (Signed)
Relevant patient education mailed to patient.  

## 2014-03-21 NOTE — Telephone Encounter (Signed)
Pt left v/m requesting rx hydrocodone apap. Call when ready for pick up.Pt going out of town 03/22/14 and request to pick up rx today.

## 2014-03-21 NOTE — Telephone Encounter (Signed)
Pt notified Rx ready for pickup 

## 2014-03-21 NOTE — Telephone Encounter (Signed)
Px printed for pick up in IN box  

## 2014-04-11 DIAGNOSIS — A879 Viral meningitis, unspecified: Secondary | ICD-10-CM

## 2014-04-11 DIAGNOSIS — M549 Dorsalgia, unspecified: Secondary | ICD-10-CM

## 2014-04-11 DIAGNOSIS — F17200 Nicotine dependence, unspecified, uncomplicated: Secondary | ICD-10-CM

## 2014-04-19 ENCOUNTER — Other Ambulatory Visit: Payer: Self-pay

## 2014-04-19 MED ORDER — HYDROCODONE-ACETAMINOPHEN 5-325 MG PO TABS: 2.0000 | ORAL_TABLET | ORAL | Status: DC | PRN

## 2014-04-19 NOTE — Telephone Encounter (Signed)
Pt left v/m requesting rx hydrocodone apap. Call when ready for pick up.  

## 2014-04-19 NOTE — Telephone Encounter (Signed)
Pt notified Rx ready for pickup 

## 2014-04-19 NOTE — Telephone Encounter (Signed)
Px printed for pick up in IN box  

## 2014-05-17 ENCOUNTER — Other Ambulatory Visit: Payer: Self-pay

## 2014-05-17 MED ORDER — GABAPENTIN 100 MG PO CAPS: 100.0000 mg | ORAL_CAPSULE | Freq: Two times a day (BID) | ORAL | Status: DC

## 2014-05-17 MED ORDER — HYDROCODONE-ACETAMINOPHEN 5-325 MG PO TABS: 2.0000 | ORAL_TABLET | ORAL | Status: DC | PRN

## 2014-05-17 NOTE — Telephone Encounter (Signed)
Px printed for pick up in IN box I sent the gabapentin electronically

## 2014-05-17 NOTE — Telephone Encounter (Signed)
Pt notified Gabapentin sent to pharmacy and Norco ready for pick-up

## 2014-05-17 NOTE — Telephone Encounter (Signed)
Pt request rx hydrocodone apap and refill gabapentin to [facility]. Pt said Dr Milinda Antis said gabapentin could be increased; pt said gabapentin 100 mg twice a day seems to be working but wants to know if Dr Milinda Antis wants to increase mg as previously discussed?Please advise.

## 2014-06-13 ENCOUNTER — Encounter: Payer: Self-pay | Admitting: Family Medicine

## 2014-06-13 ENCOUNTER — Other Ambulatory Visit: Payer: Self-pay

## 2014-06-13 MED ORDER — HYDROCODONE-ACETAMINOPHEN 5-325 MG PO TABS: 2.0000 | ORAL_TABLET | ORAL | Status: DC | PRN

## 2014-06-13 NOTE — Telephone Encounter (Signed)
Pt left v/m requesting rx hydrocodone apap. Call when ready for pick up. Last filled 05/17/14.

## 2014-06-13 NOTE — Telephone Encounter (Signed)
Px printed for pick up in IN box  

## 2014-06-13 NOTE — Telephone Encounter (Signed)
Pt notified Rx ready for pickup 

## 2014-06-26 ENCOUNTER — Encounter: Payer: Self-pay | Admitting: Family Medicine

## 2014-07-11 ENCOUNTER — Other Ambulatory Visit: Payer: Self-pay

## 2014-07-11 MED ORDER — HYDROCODONE-ACETAMINOPHEN 5-325 MG PO TABS: 2.0000 | ORAL_TABLET | ORAL | Status: DC | PRN

## 2014-07-11 NOTE — Telephone Encounter (Signed)
Px printed for pick up in IN box  

## 2014-07-11 NOTE — Telephone Encounter (Signed)
Pt notified Rx ready for pickup 

## 2014-07-11 NOTE — Telephone Encounter (Signed)
Pt left v/m requesting rx hydrocodone apap. Call when ready for pick up.  

## 2014-08-08 ENCOUNTER — Other Ambulatory Visit: Payer: Self-pay

## 2014-08-08 ENCOUNTER — Other Ambulatory Visit: Payer: Self-pay | Admitting: Family Medicine

## 2014-08-08 MED ORDER — HYDROCODONE-ACETAMINOPHEN 5-325 MG PO TABS: 2.0000 | ORAL_TABLET | ORAL | Status: DC | PRN

## 2014-08-08 NOTE — Telephone Encounter (Signed)
Ok to refill 

## 2014-08-08 NOTE — Telephone Encounter (Signed)
Pt left v/m requesting rx hydrocodone apap. Call when ready for pick up.  

## 2014-08-08 NOTE — Telephone Encounter (Signed)
Px printed for pick up in IN box  

## 2014-08-08 NOTE — Telephone Encounter (Signed)
Will refill electronically  

## 2014-08-09 NOTE — Telephone Encounter (Signed)
Pt notified Rx ready for pickup 

## 2014-09-04 ENCOUNTER — Other Ambulatory Visit: Payer: Self-pay | Admitting: *Deleted

## 2014-09-04 MED ORDER — HYDROCODONE-ACETAMINOPHEN 5-325 MG PO TABS: 2.0000 | ORAL_TABLET | ORAL | Status: DC | PRN

## 2014-09-04 NOTE — Telephone Encounter (Signed)
Pt notified Rx ready for pickup 

## 2014-09-04 NOTE — Telephone Encounter (Signed)
Px printed for pick up in IN box  

## 2014-09-04 NOTE — Telephone Encounter (Signed)
Rx last written 08/08/14, last ov was a hosp f/u in July, no future appt scheduled. Per chart pt had uds in 10/15,is low risk and next uds is due in 10/16.

## 2014-10-02 ENCOUNTER — Other Ambulatory Visit: Payer: Self-pay | Admitting: *Deleted

## 2014-10-02 MED ORDER — HYDROCODONE-ACETAMINOPHEN 5-325 MG PO TABS: 2.0000 | ORAL_TABLET | ORAL | Status: DC | PRN

## 2014-10-02 NOTE — Telephone Encounter (Signed)
Px printed for pick up in IN box  

## 2014-10-02 NOTE — Telephone Encounter (Signed)
Pt calls requesting hydrocodone rx, his las uds was in 10/15, and per result is low risk with next screen due in 10/16.

## 2014-10-02 NOTE — Telephone Encounter (Signed)
Rx is ready for pick up. Called patient and is informed.

## 2014-10-24 ENCOUNTER — Telehealth: Payer: Self-pay | Admitting: Family Medicine

## 2014-10-24 ENCOUNTER — Encounter (HOSPITAL_COMMUNITY): Payer: Self-pay

## 2014-10-24 ENCOUNTER — Emergency Department (HOSPITAL_COMMUNITY)
Admission: EM | Admit: 2014-10-24 | Discharge: 2014-10-24 | Disposition: A | Discharge status: Home / Self Care | Payer: Managed Care, Other (non HMO) | Attending: Emergency Medicine | Admitting: Emergency Medicine

## 2014-10-24 DIAGNOSIS — Z8661 Personal history of infections of the central nervous system: Secondary | ICD-10-CM | POA: Diagnosis not present

## 2014-10-24 DIAGNOSIS — Z79899 Other long term (current) drug therapy: Secondary | ICD-10-CM | POA: Diagnosis not present

## 2014-10-24 DIAGNOSIS — R109 Unspecified abdominal pain: Secondary | ICD-10-CM | POA: Insufficient documentation

## 2014-10-24 DIAGNOSIS — Z720 Tobacco use: Secondary | ICD-10-CM | POA: Diagnosis not present

## 2014-10-24 DIAGNOSIS — Z8781 Personal history of (healed) traumatic fracture: Secondary | ICD-10-CM | POA: Insufficient documentation

## 2014-10-24 DIAGNOSIS — Z8709 Personal history of other diseases of the respiratory system: Secondary | ICD-10-CM | POA: Insufficient documentation

## 2014-10-24 LAB — URINALYSIS, ROUTINE W REFLEX MICROSCOPIC
Bilirubin Urine: NEGATIVE
GLUCOSE, UA: NEGATIVE mg/dL
Hgb urine dipstick: NEGATIVE
Ketones, ur: NEGATIVE mg/dL
Leukocytes, UA: NEGATIVE
Nitrite: NEGATIVE
Protein, ur: NEGATIVE mg/dL
SPECIFIC GRAVITY, URINE: 1.038 — AB (ref 1.005–1.030)
UROBILINOGEN UA: 0.2 mg/dL (ref 0.0–1.0)
pH: 5.5 (ref 5.0–8.0)

## 2014-10-24 MED ORDER — DIAZEPAM 5 MG PO TABS
5.0000 mg | ORAL_TABLET | Freq: Once | ORAL | Status: AC
  Administered 2014-10-24: 5 mg via ORAL
  Filled 2014-10-24: qty 1

## 2014-10-24 MED ORDER — METHOCARBAMOL 500 MG PO TABS: 1000.0000 mg | ORAL_TABLET | Freq: Four times a day (QID) | ORAL | Status: DC | PRN

## 2014-10-24 MED ORDER — HYDROCODONE-ACETAMINOPHEN 5-325 MG PO TABS: ORAL_TABLET | ORAL | Status: DC

## 2014-10-24 MED ORDER — KETOROLAC TROMETHAMINE 30 MG/ML IJ SOLN
30.0000 mg | Freq: Once | INTRAMUSCULAR | Status: AC
  Administered 2014-10-24: 30 mg via INTRAVENOUS
  Filled 2014-10-24: qty 1

## 2014-10-24 NOTE — Telephone Encounter (Signed)
Patient Name: Madalyn RobJOHNATHAN Imel DOB: 09/11/1985 Initial Comment Caller states her husband is having sharp pains on right side, from back to under ribcage Nurse Assessment Nurse: Elijah Birkaldwell, RN, Stark BrayLynda Date/Time (Eastern Time): 10/24/2014 8:51:28 AM Confirm and document reason for call. If symptomatic, describe symptoms. ---Caller states her husband is having sharp pains on right side, from back to under ribcage. Symptoms started last night. Can not lay on right side, took his breath away. Has the patient traveled out of the country within the last 30 days? ---Not Applicable Does the patient require triage? ---Yes Related visit to physician within the last 2 weeks? ---No Does the PT have any chronic conditions? (i.e. diabetes, asthma, etc.) ---Yes List chronic conditions. ---back pain, fracture of spine in lower lumbar Guidelines Guideline Title Affirmed Question Affirmed Notes Flank Pain [1] SEVERE pain (e.g., excruciating, scale 8-10) AND [2] not improved after pain medicine Final Disposition User Go to ED Now Elijah Birkaldwell, RN, Lynda Comments Caller states pt. is at work. From what she understands from communication with him, the pain was severe & on & off in the flank area on right side. Strongly advised to have this checked out in the ER as soon as he can.

## 2014-10-24 NOTE — Discharge Instructions (Signed)
Please take ibuprofen 400mg  (this is normally 2 over the counter pills) every 6 hours (take with food to minimze stomach irritation).   Take robaxin and/or Vicodin for breakthrough pain, do not drink alcohol, drive, care for children or perfom other critical tasks while taking robaxin and/or Vicodin .  Please follow with your primary care doctor in the next 2 days for a check-up. They must obtain records for further management.   Do not hesitate to return to the Emergency Department for any new, worsening or concerning symptoms.    Flank Pain Flank pain refers to pain that is located on the side of the body between the upper abdomen and the back. The pain may occur over a short period of time (acute) or may be long-term or reoccurring (chronic). It may be mild or severe. Flank pain can be caused by many things. CAUSES  Some of the more common causes of flank pain include:  Muscle strains.   Muscle spasms.   A disease of your spine (vertebral disk disease).   A lung infection (pneumonia).   Fluid around your lungs (pulmonary edema).   A kidney infection.   Kidney stones.   A very painful skin rash caused by the chickenpox virus (shingles).   Gallbladder disease.  HOME CARE INSTRUCTIONS  Home care will depend on the cause of your pain. In general,  Rest as directed by your caregiver.  Drink enough fluids to keep your urine clear or pale yellow.  Only take over-the-counter or prescription medicines as directed by your caregiver. Some medicines may help relieve the pain.  Tell your caregiver about any changes in your pain.  Follow up with your caregiver as directed. SEEK IMMEDIATE MEDICAL CARE IF:   Your pain is not controlled with medicine.   You have new or worsening symptoms.  Your pain increases.   You have abdominal pain.   You have shortness of breath.   You have persistent nausea or vomiting.   You have swelling in your abdomen.   You feel  faint or pass out.   You have blood in your urine.  You have a fever or persistent symptoms for more than 2-3 days.  You have a fever and your symptoms suddenly get worse. MAKE SURE YOU:   Understand these instructions.  Will watch your condition.  Will get help right away if you are not doing well or get worse. Document Released: 10/16/2005 Document Revised: 05/19/2012 Document Reviewed: 04/08/2012 [facility] Patient Information 2015 [employer] [place]. This information is not intended to replace advice given to you by your health care provider. Make sure you discuss any questions you have with your health care provider.

## 2014-10-24 NOTE — ED Notes (Signed)
Pt called into dr office about rt upper back pain.  Pt told it could be kidney stone and told to come here.  No change in urination.  Does not recall any injury or new physical activity

## 2014-10-24 NOTE — Telephone Encounter (Signed)
Agree with that advisement  

## 2014-10-24 NOTE — ED Provider Notes (Signed)
CSN: [id number]     Arrival date & time 10/24/14  1105 History   First MD Initiated Contact with Patient 10/24/14 1209     Chief Complaint  Patient presents with  . Back Pain     (Consider location/radiation/quality/duration/timing/severity/associated sxs/prior Treatment) HPI  Kalon S Vey is a 29 y.o. male complaining of acute onset of right flank pain last night, rated at 7 out of 10, not alleviated by Vicodin and exacerbated by movement and certain positions. Patient denies dysuria, hematuria, history of kidney stones, recent injury, heavy lifting or abnormally vigorous exercise.   Past Medical History  Diagnosis Date  . Back pain     chronic low back pain, with L%-S1 stenosis/bulge sisc/arthropathy  . Closed lumbar fx w/ cord inj 2010    hx lumbar fx  . Reactive airways dysfunction syndrome     with illnesses  . Viral meningitis 7/15   Past Surgical History  Procedure Laterality Date  . Back surgery     Family History  Problem Relation Age of Onset  . Hypertension Mother    History  Substance Use Topics  . Smoking status: Current Every Day Smoker -- 0.50 packs/day    Types: Cigarettes  . Smokeless tobacco: Never Used  . Alcohol Use: Yes     Comment: rarely on holidays    Review of Systems  10 systems reviewed and found to be negative, except as noted in the HPI.   Allergies  Chantix  Home Medications   Prior to Admission medications   Medication Sig Start Date End Date Taking? Authorizing Provider  ciprofloxacin-dexamethasone (CIPRODEX) otic suspension Place 4 drops into the left ear once.   Yes Historical Provider, MD  cyclobenzaprine (FLEXERIL) 10 MG tablet TAKE ONE TABLET BY MOUTH 3 TIMES DAILY AS NEEDED. Patient taking differently: TAKE ONE TABLET BY MOUTH 3 TIMES DAILY AS NEEDED for pain 08/08/14  Yes Judy PimpleMarne A Tower, MD  gabapentin (NEURONTIN) 100 MG capsule Take 1 capsule (100 mg total) by mouth 2 (two) times daily. Patient taking differently:  Take 100 mg by mouth 3 (three) times daily.  05/17/14  Yes Judy PimpleMarne A Tower, MD  HYDROcodone-acetaminophen (NORCO/VICODIN) 5-325 MG per tablet Take 2 tablets by mouth every 4 (four) hours as needed for moderate pain. Patient taking differently: Take 2 tablets by mouth every 6 (six) hours as needed for moderate pain.  10/02/14  Yes Judy PimpleMarne A Tower, MD  nicotine (NICODERM CQ - DOSED IN MG/24 HOURS) 21 mg/24hr patch Place 1 patch (21 mg total) onto the skin daily. Patient not taking: Reported on 10/24/2014 03/13/14   Nishant Dhungel, MD   BP 125/78 mmHg  Pulse 82  Temp(Src) 98.1 F (36.7 C) (Oral)  Resp 18  Ht 5\' 8"  (1.727 m)  Wt 170 lb (77.111 kg)  BMI 25.85 kg/m2  SpO2 99% Physical Exam  Constitutional: He is oriented to person, place, and time. He appears well-developed and well-nourished. No distress.  Calm, sitting comfortably on the bed, no fidgeting  HENT:  Head: Normocephalic.  Mouth/Throat: Oropharynx is clear and moist.  Eyes: Conjunctivae and EOM are normal. Pupils are equal, round, and reactive to light.  Neck: Normal range of motion.  Cardiovascular: Normal rate, regular rhythm and intact distal pulses.   Pulmonary/Chest: Effort normal and breath sounds normal. No stridor. No respiratory distress. He has no wheezes. He has no rales. He exhibits no tenderness.  Abdominal: Soft. Bowel sounds are normal. He exhibits no distension and no mass. There is no  tenderness. There is no rebound and no guarding.  Genitourinary:  No CVA tenderness to palpation bilaterally  Musculoskeletal: Normal range of motion.  Neurological: He is alert and oriented to person, place, and time.  Psychiatric: He has a normal mood and affect.  Nursing note and vitals reviewed.   ED Course  Procedures (including critical care time) Labs Review Labs Reviewed  URINALYSIS, ROUTINE W REFLEX MICROSCOPIC - Abnormal; Notable for the following:    Specific Gravity, Urine 1.038 (*)    All other components within  normal limits    Imaging Review No results found.   EKG Interpretation None      MDM   Final diagnoses:  Left flank pain    Filed Vitals:   10/24/14 1127 10/24/14 1134 10/24/14 1342  BP: 129/87 125/78 121/78  Pulse: 82 82 87  Temp: 98.1 F (36.7 C) 98.1 F (36.7 C)   TempSrc: Oral Oral   Resp: 18 18   Height:  (1.727 m)    Weight: 170 lb (77.111 kg)    SpO2: 98% 99% 96%    Medications  ketorolac (TORADOL) 30 MG/ML injection 30 mg (30 mg Intravenous Given 10/24/14 1308)  diazepam (VALIUM) tablet 5 mg (5 mg Oral Given 10/24/14 1308)    Visente S Stamour is a pleasant 29 y.o. male presenting with acute onset of moderate right flank pain last night. This does not appear to be a kidney stone, he appears far to comfortable. Pain is exacerbated by certain positions as well, this is likely musculoskeletal. Urinalysis is without hematuria or signs of infection. Will treat with Robaxin and Vicodin and advise follow-up patient to follow closely with his primary care physician.  Evaluation does not show pathology that would require ongoing emergent intervention or inpatient treatment. Pt is hemodynamically stable and mentating appropriately. Discussed findings and plan with patient/guardian, who agrees with care plan. All questions answered. Return precautions discussed and outpatient follow up given.   Discharge Medication List as of 10/24/2014  1:37 PM    START taking these medications   Details  methocarbamol (ROBAXIN) 500 MG tablet Take 2 tablets (1,000 mg total) by mouth 4 (four) times daily as needed (Pain)., Starting 10/24/2014, Until Discontinued, Print             Wynetta Emery, PA-C 10/24/14 1536  Mirian Mo, MD 10/27/14 2056

## 2014-10-25 ENCOUNTER — Ambulatory Visit: Payer: Managed Care, Other (non HMO) | Admitting: Family Medicine

## 2014-10-30 ENCOUNTER — Other Ambulatory Visit: Payer: Self-pay | Admitting: Family Medicine

## 2014-10-30 NOTE — Telephone Encounter (Signed)
Pt left v/m requesting rx hydrocodone apap. Call when ready for pick up. Pt last seen 03/20/14 with no future appt scheduled. Last filled norco # 15 on 10/24/14.Please advise.

## 2014-10-31 MED ORDER — HYDROCODONE-ACETAMINOPHEN 5-325 MG PO TABS
ORAL_TABLET | ORAL | Status: DC
Start: 2014-10-31 — End: 2014-11-27

## 2014-10-31 NOTE — Telephone Encounter (Signed)
Px printed for pick up in IN box  

## 2014-10-31 NOTE — Telephone Encounter (Signed)
Pt called checking on his refill hydrocodone [phone]

## 2014-11-01 NOTE — Telephone Encounter (Signed)
Spoken to patient. Notified him that rx is ready for pick up. Left in the front office.

## 2014-11-27 ENCOUNTER — Other Ambulatory Visit: Payer: Self-pay

## 2014-11-27 ENCOUNTER — Other Ambulatory Visit: Payer: Self-pay | Admitting: Family Medicine

## 2014-11-27 MED ORDER — HYDROCODONE-ACETAMINOPHEN 5-325 MG PO TABS: ORAL_TABLET | ORAL | Status: DC

## 2014-11-27 NOTE — Telephone Encounter (Signed)
Px printed for pick up in IN box  

## 2014-11-27 NOTE — Telephone Encounter (Signed)
Pt left v/m requesting rx hydrocodone apap. Call when ready for pick up. Pt last seen 03/20/14.

## 2014-11-27 NOTE — Telephone Encounter (Signed)
Left message informing patient his hydrocodone rx is ready for pick up.

## 2014-12-25 ENCOUNTER — Other Ambulatory Visit: Payer: Self-pay

## 2014-12-25 MED ORDER — HYDROCODONE-ACETAMINOPHEN 5-325 MG PO TABS: ORAL_TABLET | ORAL | Status: DC

## 2014-12-25 NOTE — Telephone Encounter (Signed)
Px printed for pick up in IN box  

## 2014-12-25 NOTE — Telephone Encounter (Signed)
Pt left v/m requesting rx hydrocodone apap. Call when ready for pick up.pt last seen 03/20/14.

## 2014-12-25 NOTE — Telephone Encounter (Signed)
Spoke with patient and advised rx ready for pick-up and it will be at the front desk.  

## 2015-01-22 ENCOUNTER — Other Ambulatory Visit: Payer: Self-pay | Admitting: *Deleted

## 2015-01-22 MED ORDER — HYDROCODONE-ACETAMINOPHEN 5-325 MG PO TABS
ORAL_TABLET | ORAL | Status: DC
Start: 2015-01-22 — End: 2015-02-19

## 2015-01-22 NOTE — Telephone Encounter (Signed)
Patient left a voicemail requesting refill on Norco. Last refill 12/25/14 #180, last office visit 03/20/14. Call when ready for pickup.

## 2015-01-22 NOTE — Telephone Encounter (Signed)
Px printed for pick up in IN box  

## 2015-01-22 NOTE — Telephone Encounter (Signed)
Pt notified Rx ready for pickup 

## 2015-02-19 ENCOUNTER — Other Ambulatory Visit: Payer: Self-pay

## 2015-02-19 MED ORDER — HYDROCODONE-ACETAMINOPHEN 5-325 MG PO TABS
ORAL_TABLET | ORAL | Status: DC
Start: 2015-02-19 — End: 2015-03-19

## 2015-02-19 NOTE — Telephone Encounter (Signed)
Pt notified Rx ready for pickup 

## 2015-02-19 NOTE — Telephone Encounter (Signed)
Pt left v/m requesting rx hydrocodone apap. Call when ready for pick up. Pt seen 03/20/14 and rx last printed # 180 on 01/22/15.Please advise.

## 2015-02-19 NOTE — Telephone Encounter (Signed)
Px printed for pick up in IN box  

## 2015-03-19 ENCOUNTER — Other Ambulatory Visit: Payer: Self-pay

## 2015-03-19 MED ORDER — HYDROCODONE-ACETAMINOPHEN 5-325 MG PO TABS
ORAL_TABLET | ORAL | Status: DC
Start: 2015-03-19 — End: 2015-04-16

## 2015-03-19 NOTE — Telephone Encounter (Signed)
Pt left v/m requesting rx hydrocodone apap. Call when ready for pick up. rx last printed # 180 on 02/19/15; pt last seen 03/20/2014 and no future appt scheduled.

## 2015-03-19 NOTE — Telephone Encounter (Signed)
Pt notified Rx ready for pickup 

## 2015-03-19 NOTE — Telephone Encounter (Signed)
Px printed for pick up in IN box  

## 2015-03-28 ENCOUNTER — Other Ambulatory Visit: Payer: Self-pay | Admitting: Family Medicine

## 2015-03-29 NOTE — Telephone Encounter (Signed)
Please schedule fall f/u  Refill times 3

## 2015-03-29 NOTE — Telephone Encounter (Signed)
Electronic refill request, no recent/future appt., last refilled on 11/27/14 #90 with 3 additional refills, please advise

## 2015-03-29 NOTE — Telephone Encounter (Signed)
appt scheduled and med refilled 

## 2015-04-16 ENCOUNTER — Other Ambulatory Visit: Payer: Self-pay

## 2015-04-16 MED ORDER — HYDROCODONE-ACETAMINOPHEN 5-325 MG PO TABS: ORAL_TABLET | ORAL | Status: DC

## 2015-04-16 NOTE — Telephone Encounter (Signed)
Px printed for pick up in IN box  

## 2015-04-16 NOTE — Telephone Encounter (Signed)
Pt notified Rx ready for pickup 

## 2015-04-16 NOTE — Telephone Encounter (Signed)
Pt left v/m requesting rx hydrocodone apap. Call when ready for pick up. rx last printed # 180 on 03/19/15. Pt last seen 03/20/2014 and pt has med refill appt scheduled for 05/21/15.Please advise.

## 2015-04-25 ENCOUNTER — Other Ambulatory Visit: Payer: Self-pay | Admitting: Family Medicine

## 2015-04-25 NOTE — Telephone Encounter (Signed)
Please refill for 6 mo  He does have f/u appt with me next mo

## 2015-04-25 NOTE — Telephone Encounter (Signed)
Electronic refill request, last refilled on 10/30/14 #60 with 5 additional refills

## 2015-05-15 ENCOUNTER — Other Ambulatory Visit: Payer: Self-pay

## 2015-05-15 MED ORDER — HYDROCODONE-ACETAMINOPHEN 5-325 MG PO TABS
ORAL_TABLET | ORAL | Status: DC
Start: 2015-05-15 — End: 2015-06-11

## 2015-05-15 NOTE — Telephone Encounter (Signed)
Pt left v/m requesting rx hydrocodone apap. Call when ready for pick up. rx last printed # 180 on 04/16/15. Last seen 03/20/14.

## 2015-05-15 NOTE — Telephone Encounter (Signed)
Pt notified Rx ready for pickup 

## 2015-05-15 NOTE — Telephone Encounter (Signed)
Px printed for pick up in IN box  

## 2015-05-21 ENCOUNTER — Encounter: Payer: Self-pay | Admitting: Family Medicine

## 2015-05-21 ENCOUNTER — Ambulatory Visit (INDEPENDENT_AMBULATORY_CARE_PROVIDER_SITE_OTHER): Payer: Managed Care, Other (non HMO) | Admitting: Family Medicine

## 2015-05-21 VITALS — BP 126/82 | HR 78 | Temp 98.5°F | Ht 65.5 in | Wt 155.0 lb

## 2015-05-21 DIAGNOSIS — Z23 Encounter for immunization: Secondary | ICD-10-CM

## 2015-05-21 DIAGNOSIS — T887XXD Unspecified adverse effect of drug or medicament, subsequent encounter: Secondary | ICD-10-CM

## 2015-05-21 DIAGNOSIS — F17200 Nicotine dependence, unspecified, uncomplicated: Secondary | ICD-10-CM

## 2015-05-21 DIAGNOSIS — T50905D Adverse effect of unspecified drugs, medicaments and biological substances, subsequent encounter: Secondary | ICD-10-CM

## 2015-05-21 DIAGNOSIS — G8929 Other chronic pain: Secondary | ICD-10-CM

## 2015-05-21 DIAGNOSIS — Z720 Tobacco use: Secondary | ICD-10-CM

## 2015-05-21 DIAGNOSIS — Z13220 Encounter for screening for lipoid disorders: Secondary | ICD-10-CM | POA: Diagnosis not present

## 2015-05-21 DIAGNOSIS — M549 Dorsalgia, unspecified: Secondary | ICD-10-CM | POA: Diagnosis not present

## 2015-05-21 LAB — LIPID PANEL
Cholesterol: 153 mg/dL (ref 0–200)
HDL: 36.4 mg/dL — ABNORMAL LOW (ref >39.00)
NonHDL: 116.53
Total CHOL/HDL Ratio: 4
Triglycerides: 211 mg/dL — ABNORMAL HIGH (ref 0.0–149.0)
VLDL: 42.2 mg/dL — AB (ref 0.0–40.0)

## 2015-05-21 LAB — COMPREHENSIVE METABOLIC PANEL
ALK PHOS: 79 U/L (ref 39–117)
ALT: 22 U/L (ref 0–53)
AST: 16 U/L (ref 0–37)
Albumin: 4.6 g/dL (ref 3.5–5.2)
BILIRUBIN TOTAL: 1.2 mg/dL (ref 0.2–1.2)
BUN: 17 mg/dL (ref 6–23)
CO2: 30 meq/L (ref 19–32)
Calcium: 9.9 mg/dL (ref 8.4–10.5)
Chloride: 106 mEq/L (ref 96–112)
Creatinine, Ser: 0.76 mg/dL (ref 0.40–1.50)
GFR: 128.63 mL/min (ref >60.00)
GLUCOSE: 102 mg/dL — AB (ref 70–99)
POTASSIUM: 3.9 meq/L (ref 3.5–5.1)
SODIUM: 143 meq/L (ref 135–145)
Total Protein: 7 g/dL (ref 6.0–8.3)

## 2015-05-21 LAB — LDL CHOLESTEROL, DIRECT: LDL DIRECT: 94 mg/dL

## 2015-05-21 MED ORDER — GABAPENTIN 300 MG PO CAPS: 300.0000 mg | ORAL_CAPSULE | Freq: Two times a day (BID) | ORAL | Status: DC

## 2015-05-21 NOTE — Assessment & Plan Note (Signed)
Lipids with labs today Pt states that his diet is not optimal for fats

## 2015-05-21 NOTE — Patient Instructions (Signed)
Increase gabapentin to 300 mg twice daily for pain control  Think about quitting smoking  Take care of yourself  Labs today for liver function and cholesterol screen Flu shot today

## 2015-05-21 NOTE — Assessment & Plan Note (Signed)
Overall fairly stable  Will inc gabapentin to 300 bid as tolerated (rev poss side eff in detail) to see if this helps pain (would like to need less norco)

## 2015-05-21 NOTE — Assessment & Plan Note (Signed)
LFT for chronic use of norco  Does not drink etoh or take extra acetaminophen
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
## 2015-05-21 NOTE — Progress Notes (Signed)
Pre visit review using our clinic review tool, if applicable. No additional management support is needed unless otherwise documented below in the visit note. 

## 2015-05-21 NOTE — Assessment & Plan Note (Signed)
Disc in detail risks of smoking and possible outcomes including copd, vascular/ heart disease, cancer , respiratory and sinus infections  Pt voices understanding Pt states he is not interested in quitting right now Flu shot today

## 2015-05-22 ENCOUNTER — Encounter: Payer: Self-pay | Admitting: *Deleted

## 2015-06-11 ENCOUNTER — Other Ambulatory Visit: Payer: Self-pay

## 2015-06-11 MED ORDER — HYDROCODONE-ACETAMINOPHEN 5-325 MG PO TABS: ORAL_TABLET | ORAL | Status: DC

## 2015-06-11 NOTE — Telephone Encounter (Signed)
Px printed for pick up in IN box  

## 2015-06-11 NOTE — Telephone Encounter (Signed)
Pt left v/m requesting rx hydrocodone apap. Call when ready for pick up. rx last printed # 180 on 05/15/15. Last seen 05/21/15.

## 2015-06-11 NOTE — Telephone Encounter (Signed)
Pt notified Rx ready for pickup 

## 2015-06-29 ENCOUNTER — Other Ambulatory Visit: Payer: Self-pay | Admitting: Family Medicine

## 2015-06-29 MED ORDER — CYCLOBENZAPRINE HCL 10 MG PO TABS: 10.0000 mg | ORAL_TABLET | Freq: Three times a day (TID) | ORAL | Status: DC | PRN

## 2015-06-29 NOTE — Telephone Encounter (Signed)
Px written for call in   

## 2015-06-29 NOTE — Telephone Encounter (Signed)
Electronic refill request, last refilled on 03/29/15 #90 with 2 additional refill

## 2015-06-29 NOTE — Telephone Encounter (Signed)
Rx sent to pharmacy   

## 2015-07-09 ENCOUNTER — Other Ambulatory Visit: Payer: Self-pay

## 2015-07-09 NOTE — Telephone Encounter (Signed)
Pt left v/m requesting rx hydrocodone apap. Call when ready for pick up. rx lat printed #180 on 06/11/15 and last seen 05/21/15.

## 2015-07-10 ENCOUNTER — Encounter: Payer: Self-pay | Admitting: Family Medicine

## 2015-07-10 MED ORDER — HYDROCODONE-ACETAMINOPHEN 5-325 MG PO TABS: ORAL_TABLET | ORAL | Status: DC

## 2015-07-10 NOTE — Telephone Encounter (Signed)
Px printed for pick up in IN box  

## 2015-07-10 NOTE — Telephone Encounter (Signed)
Pt notified Rx ready for pickup 

## 2015-07-26 ENCOUNTER — Encounter: Payer: Self-pay | Admitting: Family Medicine

## 2015-08-06 ENCOUNTER — Other Ambulatory Visit: Payer: Self-pay

## 2015-08-06 MED ORDER — HYDROCODONE-ACETAMINOPHEN 5-325 MG PO TABS
ORAL_TABLET | ORAL | Status: DC
Start: 2015-08-06 — End: 2015-09-04

## 2015-08-06 NOTE — Telephone Encounter (Signed)
Px printed for pick up in IN box  Please let him know he can pick it up tomorrow

## 2015-08-06 NOTE — Telephone Encounter (Signed)
Pt left v/m requesting rx hydrocodone apap. Call when ready for pick up. rx last printed # 180 on 07/10/15. Pt seen 05/21/15.

## 2015-08-06 NOTE — Telephone Encounter (Signed)
Pt left v/m requesting update on rx when ready for pick up.

## 2015-08-07 NOTE — Telephone Encounter (Signed)
Pt notified Rx ready for pickup 

## 2015-09-04 ENCOUNTER — Other Ambulatory Visit: Payer: Self-pay

## 2015-09-04 MED ORDER — HYDROCODONE-ACETAMINOPHEN 5-325 MG PO TABS: ORAL_TABLET | ORAL | Status: DC

## 2015-09-04 NOTE — Telephone Encounter (Signed)
Pt left v/m requesting rx hydrocodone apap. Call when ready for pick up. rx last printed # 180 on 08/06/15. Last seen 05/21/15.

## 2015-09-04 NOTE — Telephone Encounter (Signed)
Px printed for pick up in IN box  

## 2015-09-04 NOTE — Telephone Encounter (Signed)
Pt notified Rx ready for pickup 

## 2015-10-01 ENCOUNTER — Other Ambulatory Visit: Payer: Self-pay

## 2015-10-01 NOTE — Telephone Encounter (Signed)
Pt left v/m requesting rx hydrocodone apap. Call when ready for pick up. Last printed # 180 on 09/04/15.last seen 05/21/15.

## 2015-10-02 MED ORDER — HYDROCODONE-ACETAMINOPHEN 5-325 MG PO TABS: ORAL_TABLET | ORAL | Status: DC

## 2015-10-02 NOTE — Telephone Encounter (Signed)
Px printed for pick up in IN box  

## 2015-10-02 NOTE — Telephone Encounter (Signed)
Pt notified Rx ready for pickup 

## 2015-10-05 ENCOUNTER — Encounter: Payer: Self-pay | Admitting: Adult Health

## 2015-10-05 ENCOUNTER — Ambulatory Visit (INDEPENDENT_AMBULATORY_CARE_PROVIDER_SITE_OTHER): Payer: Managed Care, Other (non HMO) | Admitting: Adult Health

## 2015-10-05 ENCOUNTER — Emergency Department (HOSPITAL_COMMUNITY)
Admission: EM | Admit: 2015-10-05 | Discharge: 2015-10-05 | Disposition: A | Discharge status: Home / Self Care | Payer: Worker's Compensation | Attending: Emergency Medicine | Admitting: Emergency Medicine

## 2015-10-05 ENCOUNTER — Emergency Department (HOSPITAL_COMMUNITY): Payer: Worker's Compensation

## 2015-10-05 ENCOUNTER — Encounter (HOSPITAL_COMMUNITY): Payer: Self-pay | Admitting: Emergency Medicine

## 2015-10-05 DIAGNOSIS — Y9389 Activity, other specified: Secondary | ICD-10-CM | POA: Insufficient documentation

## 2015-10-05 DIAGNOSIS — Y92410 Unspecified street and highway as the place of occurrence of the external cause: Secondary | ICD-10-CM | POA: Insufficient documentation

## 2015-10-05 DIAGNOSIS — S60511A Abrasion of right hand, initial encounter: Secondary | ICD-10-CM | POA: Diagnosis not present

## 2015-10-05 DIAGNOSIS — G8929 Other chronic pain: Secondary | ICD-10-CM | POA: Insufficient documentation

## 2015-10-05 DIAGNOSIS — Y998 Other external cause status: Secondary | ICD-10-CM | POA: Diagnosis not present

## 2015-10-05 DIAGNOSIS — F17210 Nicotine dependence, cigarettes, uncomplicated: Secondary | ICD-10-CM | POA: Diagnosis not present

## 2015-10-05 DIAGNOSIS — Z79899 Other long term (current) drug therapy: Secondary | ICD-10-CM | POA: Diagnosis not present

## 2015-10-05 DIAGNOSIS — M546 Pain in thoracic spine: Secondary | ICD-10-CM

## 2015-10-05 DIAGNOSIS — M5489 Other dorsalgia: Secondary | ICD-10-CM

## 2015-10-05 DIAGNOSIS — S8001XA Contusion of right knee, initial encounter: Secondary | ICD-10-CM | POA: Diagnosis not present

## 2015-10-05 DIAGNOSIS — Z8619 Personal history of other infectious and parasitic diseases: Secondary | ICD-10-CM | POA: Diagnosis not present

## 2015-10-05 DIAGNOSIS — Z8659 Personal history of other mental and behavioral disorders: Secondary | ICD-10-CM | POA: Diagnosis not present

## 2015-10-05 DIAGNOSIS — S30810A Abrasion of lower back and pelvis, initial encounter: Secondary | ICD-10-CM | POA: Insufficient documentation

## 2015-10-05 DIAGNOSIS — Z8709 Personal history of other diseases of the respiratory system: Secondary | ICD-10-CM | POA: Diagnosis not present

## 2015-10-05 DIAGNOSIS — S060X1A Concussion with loss of consciousness of 30 minutes or less, initial encounter: Secondary | ICD-10-CM | POA: Insufficient documentation

## 2015-10-05 DIAGNOSIS — S60512A Abrasion of left hand, initial encounter: Secondary | ICD-10-CM | POA: Insufficient documentation

## 2015-10-05 DIAGNOSIS — M25561 Pain in right knee: Secondary | ICD-10-CM

## 2015-10-05 DIAGNOSIS — Z8781 Personal history of (healed) traumatic fracture: Secondary | ICD-10-CM | POA: Insufficient documentation

## 2015-10-05 DIAGNOSIS — S0990XA Unspecified injury of head, initial encounter: Secondary | ICD-10-CM | POA: Diagnosis present

## 2015-10-05 HISTORY — DX: Attention-deficit hyperactivity disorder, unspecified type: F90.9

## 2015-10-05 MED ORDER — HYDROCODONE-ACETAMINOPHEN 5-325 MG PO TABS
2.0000 | ORAL_TABLET | Freq: Once | ORAL | Status: AC
  Administered 2015-10-05: 2 via ORAL
  Filled 2015-10-05: qty 2

## 2015-10-05 MED ORDER — IBUPROFEN 800 MG PO TABS: 800.0000 mg | ORAL_TABLET | Freq: Three times a day (TID) | ORAL | Status: DC | PRN

## 2015-10-05 MED ORDER — IBUPROFEN 800 MG PO TABS
800.0000 mg | ORAL_TABLET | Freq: Once | ORAL | Status: AC
  Administered 2015-10-05: 800 mg via ORAL
  Filled 2015-10-05: qty 1

## 2015-10-05 MED ORDER — LIDOCAINE 5 % EX PTCH: 1.0000 | MEDICATED_PATCH | CUTANEOUS | Status: DC

## 2015-10-05 NOTE — Progress Notes (Signed)
Pre visit review using our clinic review tool, if applicable. No additional management support is needed unless otherwise documented below in the visit note. 
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
## 2015-10-30 ENCOUNTER — Other Ambulatory Visit: Payer: Self-pay

## 2015-10-30 NOTE — Telephone Encounter (Signed)
Pt left v/m requesting rx hydrocodone apap. Call when ready for pick up. rx last printed # 180 on 10/02/15; last seen 05/21/15.

## 2015-10-31 MED ORDER — HYDROCODONE-ACETAMINOPHEN 5-325 MG PO TABS: ORAL_TABLET | ORAL | Status: DC

## 2015-10-31 NOTE — Telephone Encounter (Signed)
Px printed for pick up in IN box  

## 2015-10-31 NOTE — Telephone Encounter (Signed)
rx ready for pick up and patient is aware  

## 2015-11-22 ENCOUNTER — Other Ambulatory Visit: Payer: Self-pay | Admitting: Family Medicine

## 2015-11-22 MED ORDER — CYCLOBENZAPRINE HCL 10 MG PO TABS: ORAL_TABLET | ORAL | Status: DC

## 2015-11-22 NOTE — Telephone Encounter (Signed)
Px written for call in   

## 2015-11-22 NOTE — Telephone Encounter (Signed)
Rx sent 

## 2015-11-22 NOTE — Telephone Encounter (Signed)
Last refilled on 06/29/15 #90 with 3 additional refills, please advise

## 2015-11-26 ENCOUNTER — Other Ambulatory Visit: Payer: Self-pay

## 2015-11-26 MED ORDER — HYDROCODONE-ACETAMINOPHEN 5-325 MG PO TABS: ORAL_TABLET | ORAL | Status: DC

## 2015-11-26 NOTE — Telephone Encounter (Signed)
Pt left v/m requesting rx hydrocodone apap. Call when ready for pick up. rx last printed # 180 on 10/31/15. Last seen 05/21/15.

## 2015-11-26 NOTE — Telephone Encounter (Signed)
Px printed for pick up in IN box  

## 2015-11-27 NOTE — Telephone Encounter (Signed)
Pt notified Rx ready for pickup 

## 2015-12-25 ENCOUNTER — Other Ambulatory Visit: Payer: Self-pay

## 2015-12-25 MED ORDER — HYDROCODONE-ACETAMINOPHEN 5-325 MG PO TABS: ORAL_TABLET | ORAL | Status: DC

## 2015-12-25 NOTE — Telephone Encounter (Signed)
Px printed for pick up in IN box  

## 2015-12-25 NOTE — Telephone Encounter (Signed)
Pt notified Rx ready for pickup 

## 2015-12-25 NOTE — Telephone Encounter (Signed)
Pt left v/m requesting rx hydrocodone apap. Call when ready for pick up. rx last printed # 180 on 11/26/15. Pt last seen 05/21/15.

## 2016-01-14 ENCOUNTER — Encounter: Payer: Self-pay | Admitting: Family Medicine

## 2016-01-14 ENCOUNTER — Ambulatory Visit (INDEPENDENT_AMBULATORY_CARE_PROVIDER_SITE_OTHER): Payer: Managed Care, Other (non HMO) | Admitting: Family Medicine

## 2016-01-14 ENCOUNTER — Telehealth: Payer: Self-pay | Admitting: Family Medicine

## 2016-01-14 VITALS — BP 130/76 | HR 81 | Temp 98.2°F | Ht 65.5 in | Wt 149.6 lb

## 2016-01-14 DIAGNOSIS — F4321 Adjustment disorder with depressed mood: Secondary | ICD-10-CM

## 2016-01-14 DIAGNOSIS — F1920 Other psychoactive substance dependence, uncomplicated: Secondary | ICD-10-CM | POA: Diagnosis not present

## 2016-01-14 DIAGNOSIS — F4320 Adjustment disorder, unspecified: Secondary | ICD-10-CM | POA: Insufficient documentation

## 2016-01-14 MED ORDER — MIRTAZAPINE 15 MG PO TABS: 15.0000 mg | ORAL_TABLET | Freq: Every day | ORAL | Status: DC

## 2016-01-14 NOTE — Progress Notes (Signed)
Pre visit review using our clinic review tool, if applicable. No additional management support is needed unless otherwise documented below in the visit note. 

## 2016-01-14 NOTE — Progress Notes (Signed)
Subjective:    Patient ID: Jacob Newton, male    DOB: 11-06-1985, 30 y.o.   MRN: [id number]  HPI Here for f/u regarding pain medication   Pt determined he was dependent on it  norco 5-325  Totally off since Thursday  Very sore /can't sleep  Nausea / no vomiting  Some shakes and chills /sweats  Not getting better yet  Cravings   Wife and he wants him to get off of norco and also  Wife said his breathing was shallow at night  And he also "looked bad"   Also wants to quit smoking   He was taking his wife's norco  Has known for a while that he had a problem No prev history  Drinks alcohol occ -never a problem  No other drugs at  Did supplement with percocet and morphine -but not recently    Was taking 180 per month - - this was often gone in 2 weeks  Days he took up to ? 20-30 pills   Wife notes he also took more when he hurt his ankle - took a friend's med - symboxin? Unsure   Lost 11 lb since last visit  Was starting to affect his work / sluggish   Wife will not fill her medication -so it is not in the house   Smokes a lot and not getting much sleep right now   Wife is the only one that knows (and her mother) and his mother He had just moved out of the family's house  Moved back into the house now   Mood-: not a lot of worry Voiced feeling worthless  ? If hopeless   Melatonin and benadryl don't help with sleep  Patient Active Problem List   Diagnosis Date Noted  . Screening for lipoid disorders 05/21/2015  . Meningitis, viral 03/13/2014  . Adverse effects of medication 09/05/2013  . ADD (attention deficit disorder) 06/28/2012  . Poor concentration 02/23/2012  . Routine general medical examination at a health care facility 02/12/2012  . Smoker 08/22/2011  . Chronic back pain 02/07/2010   Past Medical History  Diagnosis Date  . Back pain     chronic low back pain, with L%-S1 stenosis/bulge sisc/arthropathy  . Closed lumbar fx w/ cord inj 2010    hx lumbar fx  . Reactive airways dysfunction syndrome     with illnesses  . Viral meningitis 7/15  . ADHD (attention deficit hyperactivity disorder)    Past Surgical History  Procedure Laterality Date  . Back surgery     Social History  Substance Use Topics  . Smoking status: Current Every Day Smoker -- 1.00 packs/day    Types: Cigarettes  . Smokeless tobacco: Never Used  . Alcohol Use: 0.0 oz/week    0 Standard drinks or equivalent per week     Comment: rarely on holidays   Family History  Problem Relation Age of Onset  . Hypertension Mother   . Diabetes Mother    Allergies  Allergen Reactions  . Chantix [Varenicline Tartrate] Other (See Comments)    Bad dreams   Current Outpatient Prescriptions on File Prior to Visit  Medication Sig Dispense Refill  . cyclobenzaprine (FLEXERIL) 10 MG tablet TAKE ONE TABLET BY MOUTH 3 TIMES DAILY AS NEEDED 90 tablet 3  . gabapentin (NEURONTIN) 300 MG capsule Take 1 capsule (300 mg total) by mouth 2 (two) times daily. 60 capsule 11  . ibuprofen (ADVIL,MOTRIN) 800 MG tablet Take 1 tablet (800 mg  total) by mouth every 8 (eight) hours as needed for mild pain or moderate pain. 15 tablet 0  . lidocaine (LIDODERM) 5 % Place 1 patch onto the skin daily. Remove & Discard patch within 12 hours or as directed by MD 30 patch 0   No current facility-administered medications on file prior to visit.    Review of Systems Review of Systems  Constitutional: Negative for fever, pos for malaise and dec appetite and wt loss   Eyes: Negative for pain and visual disturbance.  Respiratory: Negative for cough and shortness of breath.   Cardiovascular: Negative for cp or palpitations    Gastrointestinal: Negative for , diarrhea and constipation. Pos for nausea w/o vomiting Genitourinary: Negative for urgency and frequency.  Skin: Negative for pallor or rash   Neurological: Negative for weakness, light-headedness, numbness and headaches.  MSK pos for chronic  back pain  Hematological: Negative for adenopathy. Does not bruise/bleed easily.  Psychiatric/Behavioral: pos for depression and anxiety symptoms/insomnia and drug craving        Objective:   Physical Exam  Constitutional: He appears well-developed. No distress.  underwt and fatigued appearing  HENT:  Head: Normocephalic and atraumatic.  Mouth/Throat: Oropharynx is clear and moist.  Eyes: Conjunctivae and EOM are normal. Pupils are equal, round, and reactive to light. Right eye exhibits no discharge. Left eye exhibits no discharge. No scleral icterus.  Neck: Normal range of motion. Neck supple. No JVD present. No thyromegaly present.  Cardiovascular: Normal rate, regular rhythm and normal heart sounds.   Pulmonary/Chest: Effort normal and breath sounds normal. No respiratory distress. He has no wheezes.  Diffusely distant bs   Musculoskeletal: He exhibits no edema.  Lymphadenopathy:    He has no cervical adenopathy.  Neurological: He is alert. He has normal reflexes. He displays tremor. No cranial nerve deficit. He exhibits normal muscle tone. Coordination normal.  Mild hand tremor   Skin: Skin is warm and dry. No rash noted. He is not diaphoretic. No erythema. No pallor.  Psychiatric: His mood appears anxious. His speech is delayed. He is slowed and withdrawn. Thought content is not paranoid. He exhibits a depressed mood. He expresses no homicidal and no suicidal ideation.  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 Reviewed stressors/ coping techniques/symptoms/ support sources/ tx options and side effects in detail today  Plan f/u 2 wk  No longer filling his or wife's px  Will follow closely If he changes his mind re: rehabilitation program (in or outpt) -will update [place]  >25 minutes spent in face to face time with patient, >50% spent in counselling or coordination of care       Relevant Orders   Ambulatory referral to Psychology   Adjustment reaction    Pt here for narcotic w/d and addiction -denies depression before this  time (states he was not self medicating)- but now suffers from symptoms of worthlessness and hopelessness (he denies SI) Trouble sleeping  In the process of withdrawal with support (declines rehab) Ref to counseling Trial of mirtazapine 15 mg qhs to help with sleep and depression Discussed expectations of this  medication including time to effectiveness and mechanism of action, also poss of side effects (early and late)- including mental fuzziness, weight or appetite change, nausea and poss of worse dep or anxiety (even suicidal thoughts)  Pt voiced understanding and will stop med and update if this occurs    F/u 2 wk or earlier if needed       Relevant Orders   Ambulatory referral to Psychology

## 2016-01-14 NOTE — Assessment & Plan Note (Signed)
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

## 2016-01-14 NOTE — Telephone Encounter (Signed)
[facility] Pt is aware they will call to schedule. He is also aware he can call and schedule as well.

## 2016-01-14 NOTE — Assessment & Plan Note (Signed)
Pt here for narcotic w/d and addiction -denies depression before this time (states he was not self medicating)- but now suffers from symptoms of worthlessness and hopelessness (he denies SI) Trouble sleeping  In the process of withdrawal with support (declines rehab) Ref to counseling Trial of mirtazapine 15 mg qhs to help with sleep and depression Discussed expectations of this  medication including time to effectiveness and mechanism of action, also poss of side effects (early and late)- including mental fuzziness, weight or appetite change, nausea and poss of worse dep or anxiety (even suicidal thoughts)  Pt voiced understanding and will stop med and update if this occurs    F/u 2 wk or earlier if needed

## 2016-01-14 NOTE — Patient Instructions (Addendum)
Drink lots of fluids  Take ibuprofen for pain as needed up to every 8 hours with food  If symptoms get too bad to handle - go to [place] / to behavioral health  Have your wife administer medicines and keep a log of it  Stop at check out for referral to counselor  Keep me posted Follow up with me in about 2 weeks (earlier if needed) Start mirtazapine at night - if any worsening mood symptoms or intolerable side effects please let me know and stop it

## 2016-01-29 ENCOUNTER — Ambulatory Visit: Payer: Self-pay | Admitting: Family Medicine

## 2016-01-29 ENCOUNTER — Telehealth: Payer: Self-pay | Admitting: Family Medicine

## 2016-01-29 DIAGNOSIS — Z0289 Encounter for other administrative examinations: Secondary | ICD-10-CM

## 2016-01-29 NOTE — Telephone Encounter (Signed)
Please check in with him to see how he is doing and see if he wants to f/u

## 2016-01-29 NOTE — Telephone Encounter (Signed)
Spoke with pt he wanted to know if he needed to r/s appointment.  He didn't want to  But stated if dr tower needed him to come in for follow he would

## 2016-01-29 NOTE — Telephone Encounter (Signed)
Patient did not come for their scheduled appointment today 2 week follow up  Please let me know if the patient needs to be contacted immediately for follow up or if no follow up is necessary.

## 2016-01-29 NOTE — Telephone Encounter (Signed)
This will be up to him to make an appt if he wants to

## 2016-02-15 ENCOUNTER — Encounter: Payer: Self-pay | Admitting: Family Medicine

## 2016-03-14 ENCOUNTER — Emergency Department (HOSPITAL_COMMUNITY)
Admission: EM | Admit: 2016-03-14 | Discharge: 2016-03-14 | Disposition: A | Discharge status: Home / Self Care | Payer: Managed Care, Other (non HMO) | Attending: Emergency Medicine | Admitting: Emergency Medicine

## 2016-03-14 ENCOUNTER — Telehealth: Payer: Self-pay

## 2016-03-14 ENCOUNTER — Encounter (HOSPITAL_COMMUNITY): Payer: Self-pay | Admitting: Oncology

## 2016-03-14 DIAGNOSIS — F1910 Other psychoactive substance abuse, uncomplicated: Secondary | ICD-10-CM | POA: Insufficient documentation

## 2016-03-14 DIAGNOSIS — F1110 Opioid abuse, uncomplicated: Secondary | ICD-10-CM

## 2016-03-14 DIAGNOSIS — Z79899 Other long term (current) drug therapy: Secondary | ICD-10-CM | POA: Insufficient documentation

## 2016-03-14 DIAGNOSIS — F17210 Nicotine dependence, cigarettes, uncomplicated: Secondary | ICD-10-CM | POA: Insufficient documentation

## 2016-03-14 DIAGNOSIS — F1920 Other psychoactive substance dependence, uncomplicated: Secondary | ICD-10-CM

## 2016-03-14 DIAGNOSIS — Z7982 Long term (current) use of aspirin: Secondary | ICD-10-CM | POA: Insufficient documentation

## 2016-03-14 HISTORY — DX: Opioid abuse, uncomplicated: F11.10

## 2016-03-14 LAB — CBC WITH DIFFERENTIAL/PLATELET
BASOS ABS: 0 10*3/uL (ref 0.0–0.1)
Basophils Relative: 0 %
EOS ABS: 0.1 10*3/uL (ref 0.0–0.7)
EOS PCT: 1 %
HCT: 43.5 % (ref 39.0–52.0)
HEMOGLOBIN: 15 g/dL (ref 13.0–17.0)
Lymphocytes Relative: 31 %
Lymphs Abs: 2.8 10*3/uL (ref 0.7–4.0)
MCH: 30 pg (ref 26.0–34.0)
MCHC: 34.5 g/dL (ref 30.0–36.0)
MCV: 87 fL (ref 78.0–100.0)
Monocytes Absolute: 0.5 10*3/uL (ref 0.1–1.0)
Monocytes Relative: 6 %
NEUTROS PCT: 62 %
Neutro Abs: 5.6 10*3/uL (ref 1.7–7.7)
PLATELETS: 283 10*3/uL (ref 150–400)
RBC: 5 MIL/uL (ref 4.22–5.81)
RDW: 13.2 % (ref 11.5–15.5)
WBC: 9.1 10*3/uL (ref 4.0–10.5)

## 2016-03-14 LAB — COMPREHENSIVE METABOLIC PANEL
ALBUMIN: 5 g/dL (ref 3.5–5.0)
ALT: 21 U/L (ref 17–63)
ANION GAP: 8 (ref 5–15)
AST: 31 U/L (ref 15–41)
Alkaline Phosphatase: 64 U/L (ref 38–126)
BILIRUBIN TOTAL: 1.5 mg/dL — AB (ref 0.3–1.2)
BUN: 17 mg/dL (ref 6–20)
CHLORIDE: 105 mmol/L (ref 101–111)
CO2: 28 mmol/L (ref 22–32)
Calcium: 9.3 mg/dL (ref 8.9–10.3)
Creatinine, Ser: 1.01 mg/dL (ref 0.61–1.24)
GFR calc Af Amer: >60 mL/min (ref >60)
GLUCOSE: 93 mg/dL (ref 65–99)
POTASSIUM: 3.7 mmol/L (ref 3.5–5.1)
Sodium: 141 mmol/L (ref 135–145)
TOTAL PROTEIN: 7.9 g/dL (ref 6.5–8.1)

## 2016-03-14 LAB — SALICYLATE LEVEL: Salicylate Lvl: <4 mg/dL (ref 2.8–30.0)

## 2016-03-14 LAB — ETHANOL

## 2016-03-14 LAB — ACETAMINOPHEN LEVEL

## 2016-03-14 MED ORDER — DICYCLOMINE HCL 20 MG PO TABS: 20.0000 mg | ORAL_TABLET | Freq: Two times a day (BID) | ORAL | Status: DC

## 2016-03-14 MED ORDER — CLONIDINE HCL 0.1 MG PO TABS
0.1000 mg | ORAL_TABLET | Freq: Once | ORAL | Status: AC
  Administered 2016-03-14: 0.1 mg via ORAL
  Filled 2016-03-14: qty 1

## 2016-03-14 MED ORDER — ONDANSETRON HCL 4 MG PO TABS: 4.0000 mg | ORAL_TABLET | Freq: Four times a day (QID) | ORAL | Status: DC

## 2016-03-14 MED ORDER — CLONIDINE HCL 0.1 MG PO TABS: 0.1000 mg | ORAL_TABLET | Freq: Two times a day (BID) | ORAL | Status: DC

## 2016-03-14 MED ORDER — NICOTINE 21 MG/24HR TD PT24
21.0000 mg | MEDICATED_PATCH | Freq: Every day | TRANSDERMAL | Status: DC
  Administered 2016-03-14: 21 mg via TRANSDERMAL
  Filled 2016-03-14: qty 1

## 2016-03-14 NOTE — Telephone Encounter (Signed)
I have a few names and numbers for help with rehab/some with detox  [facility] 336 [phone]  [facility] 1 [phone]  [facility] [phone]  A well regarded private counselor in [place] is Jacob Newton with [facility] at [phone]  If any of these numbers are off by a digit- can google them (hope I got them right)

## 2016-03-14 NOTE — Discharge Instructions (Signed)
Follow-up with any of the outpatient treatment programs for definitive care of your opiate addiction. Return to ED for new or worsening symptoms.   Substance Abuse Treatment Programs  Intensive Outpatient Programs [facility]     [address]      [place], [place]                   [phone]       [facility] [address] [place], [place] [phone]  [facility]     (Inpatient and outpatient)     [address]           [phone]    [facility] [phone] (Suboxone and Methadone)  [address]      [place], [place] [address]      [phone]       [address] [place], [place] [phone]  [facility] (Outpatient/Inpatient, [facility])    (insurance only) [phone]             [facility] (Groups & Residential) [place], [place] [phone]     [facility]     [address]     [place], [place]      [phone]       [facility] (for caregivers and family) [phone] [address] [place], [place] [phone]      Residential Treatment Programs [facility]      [address], [place], [place] [address]  [phone]       [facility] [address] [place], [place] [address] [phone]  [facility]        [phone]       [facility] [phone]  [facility]             [address]                                         [place], [place]                                                [phone] or [phone]                               [facility] [place] [address] [place], [address] [phone]  [facility]    [address]      [place], [place]     [phone]       [facility] [address] [place], [place] [phone]  [facility]   [address]     [place], [place] [address]     [phone]      Admissions: 8am-3pm M-F  [facility] ([facility]) [address] [place], [place] [phone]  [facility]: Residential Program [phone](90 Days)   [place], [place]      [phone] or [phone]     [facility]: [facility] [place], [place] (Walk in Hours over the weekend or by referral)  [facility] [address] [place], [place], [place] [address] [phone]  Crisis Mobile: [facility]:  [phone] (  for crisis response 24 hours a day) [place]:      [phone] Outpatient Psychiatry and Counseling  [facility]: Mobile Crisis Management 24 hours:  [phone]  [facility] [employer] sliding scale fee and walk in schedule: M-F 8am-12pm/1pm-3pm [place], [place] [address] [place], [place] [address] [phone]  [facility] (Formerly known as [facility] [employer])- new patient walk-in appointments available Monday - Friday 8am -3pm.          [address] [place], [place] [address] [phone] or crisis line- [facility]/ Intensive Outpatient Therapy Program [place], [place] [address] [place]      [phone] [address] [place], [place] [address]                 [place]   [facility] [phone]. [place], [place] [address]   [employer] [facility] Care          [address] Johnette Abraham  [place], [place] [address]       [phone]  [facility] [address], [place], [place] [address] [phone]  [facility]    [address] [place], [place]  [address]     [place], [place] Joycelyn Man     [place] [address]     [phone]       [facility] [place] [address]  [facility]     [address] [place], [place], [place], [place] [address] [place]     [address]     [place], [place] [address]     [phone]       [facility] [address] [place], [place] [address] [phone] Resources for Temporary Residential Assistance/Crisis [facility]) M-F 8am-3pm   [address], [place] [address]   [phone] Services include: laundry, barbering, support groups, case management, phone  & computer access, showers, AA/NA mtgs, mental health/substance abuse nurse, job skills class, disability information, VA assistance, spiritual classes, etc.   [facility]   [address] [place]     [place]              [employer] (women and children)       [address] [place], [place] [address] [phone] [email or web address] for application and process Application Required  [facility]   [address]    [place] [address]     [phone]                    [place],  [address] [phone](IOMBTDHR application appt.) Application Required  [employer] (women  only)    [place]     [place], [place] [address]     [phone]      Intake starts 6pm daily Need valid ID, SSC, & Police report [employer] [address] [place], [place] [phone] Application Required  [employer] (men only)     Linda.       [place], [place]       [facility] (Pregnant women only) [address] [place], [place]  [facility]      [place] [address].      [place], [place] [address]     [phone]             [facility] [address] [place], [place] 90 day commitment/SA/Application process  [facility](men only)     [address]     [place], [place]       Check-in at [facility] [address] [place], [place] [address] [phone] Men/Women/Women and Children must be there by 7 pm  [place], [place]

## 2016-03-14 NOTE — ED Notes (Signed)
Pt has a hx of opiate abuse.  Pt attempted to self detox one week ago.  Did not use for approximately 5 days.  On Friday pt took 60 mg morphine PO, on Thursday used 15 mg of morphine PO.  Per pt's wife [facility] who referred them to come here to be medically cleared.  Pt is requesting inpatient detox at this time.  Pt has been abusing opiates since 2009.

## 2016-03-14 NOTE — ED Provider Notes (Signed)
CSN: [id number]     Arrival date & time 03/14/16  0232 History   First MD Initiated Contact with Patient 03/14/16 0247     Chief Complaint  Patient presents with  . Medical Clearance     (Consider location/radiation/quality/duration/timing/severity/associated sxs/prior Treatment) HPI Jacob Newton is a 30 y.o. male with history of opiate abuse here for evaluation of opiate detox and medical clearance. Patient accompanied by wife reports there in contact with Behavioral Health, told he needed to come to ED for medical clearance. Patient reports he has been opiate abuser since 2009, most recently this morning 15 mg of morphine. He denies any fevers, chills, nausea or vomiting, abdominal pain or agitation. Denies any auditory or visual hallucinations, suicidal or homicidal ideations. No alcohol or benzodiazepine use. Denies any other illicit drug use. No other modifying factors.  Past Medical History  Diagnosis Date  . Back pain     chronic low back pain, with L%-S1 stenosis/bulge sisc/arthropathy  . Closed lumbar fx w/ cord inj 2010    hx lumbar fx  . Reactive airways dysfunction syndrome     with illnesses  . Viral meningitis 7/15  . ADHD (attention deficit hyperactivity disorder)   . Opiate abuse, continuous    Past Surgical History  Procedure Laterality Date  . Back surgery     Family History  Problem Relation Age of Onset  . Hypertension Mother   . Diabetes Mother    Social History  Substance Use Topics  . Smoking status: Current Every Day Smoker -- 1.00 packs/day    Types: Cigarettes  . Smokeless tobacco: Never Used  . Alcohol Use: 0.0 oz/week    0 Standard drinks or equivalent per week     Comment: rarely on holidays    Review of Systems A 10 point review of systems was completed and was negative except for pertinent positives and negatives as mentioned in the history of present illness     Allergies  Chantix  Home Medications   Prior to Admission  medications   Medication Sig Start Date End Date Taking? Authorizing Provider  Aspirin-Salicylamide-Caffeine (BC HEADACHE) 325-95-16 MG TABS Take 1 each by mouth as needed (headache).   Yes Historical Provider, MD  cyclobenzaprine (FLEXERIL) 10 MG tablet TAKE ONE TABLET BY MOUTH 3 TIMES DAILY AS NEEDED Patient taking differently: Take 10 mg by mouth 3 (three) times daily as needed for muscle spasms.  11/22/15  Yes Judy PimpleMarne A Tower, MD  gabapentin (NEURONTIN) 300 MG capsule Take 1 capsule (300 mg total) by mouth 2 (two) times daily. 05/21/15  Yes Judy PimpleMarne A Tower, MD  MELATONIN PO Take 1 tablet by mouth at bedtime as needed (sleep).   Yes Historical Provider, MD  mirtazapine (REMERON) 15 MG tablet Take 1 tablet (15 mg total) by mouth at bedtime. 01/14/16  Yes Judy PimpleMarne A Tower, MD  cloNIDine (CATAPRES) 0.1 MG tablet Take 1 tablet (0.1 mg total) by mouth 2 (two) times daily. 03/14/16   Joycie PeekBenjamin Labradford Schnitker, PA-C  dicyclomine (BENTYL) 20 MG tablet Take 1 tablet (20 mg total) by mouth 2 (two) times daily. 03/14/16   Joycie PeekBenjamin Rilea Arutyunyan, PA-C  ibuprofen (ADVIL,MOTRIN) 800 MG tablet Take 1 tablet (800 mg total) by mouth every 8 (eight) hours as needed for mild pain or moderate pain. Patient not taking: Reported on 03/14/2016 10/05/15   Trixie DredgeEmily West, PA-C  lidocaine (LIDODERM) 5 % Place 1 patch onto the skin daily. Remove & Discard patch within 12 hours or as directed by MD  Patient not taking: Reported on 03/14/2016 10/05/15   Trixie DredgeEmily West, PA-C  ondansetron (ZOFRAN) 4 MG tablet Take 1 tablet (4 mg total) by mouth every 6 (six) hours. 03/14/16   Sadhana Frater, PA-C   BP 120/75 mmHg  Pulse 125  Temp(Src) 98.2 F (36.8 C) (Oral)  Resp 20  Ht 5\' 9"  (1.753 m)  Wt 72.576 kg  BMI 23.62 kg/m2  SpO2 100% Physical Exam  Constitutional: He is oriented to person, place, and time. He appears well-developed and well-nourished.  HENT:  Head: Normocephalic and atraumatic.  Mouth/Throat: Oropharynx is clear and moist.  Eyes: Conjunctivae are  normal. Pupils are equal, round, and reactive to light. Right eye exhibits no discharge. Left eye exhibits no discharge. No scleral icterus.  Pupils are slightly constricted.  Neck: Neck supple.  Cardiovascular: Regular rhythm and normal heart sounds.   Mild tachycardia abnormal heart sounds  Pulmonary/Chest: Effort normal and breath sounds normal. No respiratory distress. He has no wheezes. He has no rales.  Abdominal: Soft. There is no tenderness.  Musculoskeletal: He exhibits no tenderness.  Neurological: He is alert and oriented to person, place, and time.  Cranial Nerves II-XII grossly intact  Skin: Skin is warm and dry. No rash noted.  Psychiatric: He has a normal mood and affect.  Nursing note and vitals reviewed.   ED Course  Procedures (including critical care time) Labs Review Labs Reviewed  COMPREHENSIVE METABOLIC PANEL - Abnormal; Notable for the following:    Total Bilirubin 1.5 (*)    All other components within normal limits  ACETAMINOPHEN LEVEL - Abnormal; Notable for the following:    Acetaminophen (Tylenol), Serum <10 (*)    All other components within normal limits  ETHANOL  SALICYLATE LEVEL  CBC WITH DIFFERENTIAL/PLATELET  URINE RAPID DRUG SCREEN, HOSP PERFORMED    Imaging Review No results found. I have personally reviewed and evaluated these images and lab results as part of my medical decision-making.   EKG Interpretation None     Filed Vitals:   03/14/16 0244 03/14/16 0358 03/14/16 0537  BP: 139/102 120/75 123/88  Pulse: 125  103  Temp: 98.2 F (36.8 C)  97.9 F (36.6 C)  TempSrc: Oral  Oral  Resp: 20  17  Height: 5\' 9"  (1.753 m)    Weight: 72.576 kg    SpO2: 100%  99%   Meds given in ED:  Medications  nicotine (NICODERM CQ - dosed in mg/24 hours) patch 21 mg (21 mg Transdermal Patch Applied 03/14/16 0358)  cloNIDine (CATAPRES) tablet 0.1 mg (0.1 mg Oral Given 03/14/16 0337)    New Prescriptions   CLONIDINE (CATAPRES) 0.1 MG TABLET     Take 1 tablet (0.1 mg total) by mouth 2 (two) times daily.   DICYCLOMINE (BENTYL) 20 MG TABLET    Take 1 tablet (20 mg total) by mouth 2 (two) times daily.   ONDANSETRON (ZOFRAN) 4 MG TABLET    Take 1 tablet (4 mg total) by mouth every 6 (six) hours.    MDM  Patient here for medical clearance for opiate detox program. On arrival, he is calm denies any discomfort. He is tachycardic, likely secondary to opioid withdrawal. Physical exam is unremarkable. Labs are reassuring. Patient is medically clear at this time. Stable for outpatient follow-up and detox program. Return precautions discussed. Final diagnoses:  Addiction to drug [facility](HCC)  Opiate abuse, continuous       Joycie PeekBenjamin Marquie Aderhold, PA-C 03/14/16 0542  Gilda Creasehristopher J Pollina, MD 03/14/16 [phone]

## 2016-03-14 NOTE — ED Notes (Signed)
Pt not able to urinate.   

## 2016-03-14 NOTE — Telephone Encounter (Signed)
Left voicemail requesting pt or wife to call office back, DR. Tower wants to look into rehab places message sent back to her

## 2016-03-14 NOTE — Telephone Encounter (Signed)
Jacob DroughtErica DPR signed left v/m that when pt was seen 01/14/16 pt did not seek out counselor for drug addiction; [facility] and pt or pts wife request cb with Dr Royden Purlower's suggestion of who pt should see for outpt rehab. Jacob Droughtrica wants to get pt to see someone ASAP.

## 2016-03-14 NOTE — Telephone Encounter (Signed)
[place]- I got a hold of them so no need to call, thanks--MT

## 2016-03-14 NOTE — Telephone Encounter (Signed)
Are they detoxing him in the hospital or is he home?

## 2016-03-14 NOTE — ED Notes (Signed)
Pt has been seen and wanded by security.  Pt has 1 bag of belongings.   Pt has cellphone.

## 2016-05-19 ENCOUNTER — Encounter (HOSPITAL_COMMUNITY): Payer: Self-pay | Admitting: Emergency Medicine

## 2016-05-19 ENCOUNTER — Emergency Department (HOSPITAL_COMMUNITY)
Admission: EM | Admit: 2016-05-19 | Discharge: 2016-05-19 | Disposition: A | Discharge status: Home / Self Care | Payer: Managed Care, Other (non HMO) | Attending: Emergency Medicine | Admitting: Emergency Medicine

## 2016-05-19 DIAGNOSIS — F909 Attention-deficit hyperactivity disorder, unspecified type: Secondary | ICD-10-CM | POA: Insufficient documentation

## 2016-05-19 DIAGNOSIS — Z79899 Other long term (current) drug therapy: Secondary | ICD-10-CM | POA: Insufficient documentation

## 2016-05-19 DIAGNOSIS — F17210 Nicotine dependence, cigarettes, uncomplicated: Secondary | ICD-10-CM | POA: Insufficient documentation

## 2016-05-19 DIAGNOSIS — G4489 Other headache syndrome: Secondary | ICD-10-CM | POA: Insufficient documentation

## 2016-05-19 DIAGNOSIS — T401X1A Poisoning by heroin, accidental (unintentional), initial encounter: Secondary | ICD-10-CM | POA: Insufficient documentation

## 2016-05-19 DIAGNOSIS — Z7982 Long term (current) use of aspirin: Secondary | ICD-10-CM | POA: Insufficient documentation

## 2016-05-19 MED ORDER — NALOXONE HCL 0.4 MG/ML IJ SOLN
0.4000 mg | Freq: Once | INTRAMUSCULAR | Status: AC
  Administered 2016-05-19: 0.4 mg via INTRAVENOUS
  Filled 2016-05-19: qty 1

## 2016-05-19 MED ORDER — SUMATRIPTAN SUCCINATE 6 MG/0.5ML ~~LOC~~ SOLN
6.0000 mg | Freq: Once | SUBCUTANEOUS | Status: AC
  Administered 2016-05-19: 6 mg via SUBCUTANEOUS
  Filled 2016-05-19: qty 0.5

## 2016-05-19 MED ORDER — SUMATRIPTAN SUCCINATE 100 MG PO TABS: 100.0000 mg | ORAL_TABLET | ORAL | 0 refills | Status: DC | PRN

## 2016-05-19 MED ORDER — ONDANSETRON HCL 4 MG/2ML IJ SOLN
4.0000 mg | Freq: Once | INTRAMUSCULAR | Status: AC
  Administered 2016-05-19: 4 mg via INTRAVENOUS
  Filled 2016-05-19: qty 2

## 2016-05-19 NOTE — ED Notes (Signed)
Bed: WA11 Expected date:  Expected time:  Means of arrival:  Comments: EMS/overdose 

## 2016-05-19 NOTE — ED Provider Notes (Signed)
[facility]-EMERGENCY DEPT Provider Note   CSN: [id number] Arrival date & time: 05/19/16  1325     History   Chief Complaint Chief Complaint  Patient presents with  . Drug Overdose    Heroin    HPI Jacob RuddyJohnathan S Newton is a 30 y.o. male.  Patient is a 30 year old male with a history of chronic opiate abuse, heroin abuse, chronic back pain presenting today by EMS after wife found him And purple in his truck after shooting up heroin called gray death. Wife states she was able to physically arouse him enough to start breathing again his color returned. She states he has never been suicidal and had been in a rehabilitation program for the last 30 days and been clean until today. He does take gabapentin, Robaxin for chronic back issues.  Patient is drowsy but denies any suicidal ideation.   The history is provided by the spouse.  Drug Overdose  This is a new problem. The current episode started 1 to 2 hours ago. The problem occurs constantly. The problem has been gradually improving.    Past Medical History:  Diagnosis Date  . ADHD (attention deficit hyperactivity disorder)   . Back pain    chronic low back pain, with L%-S1 stenosis/bulge sisc/arthropathy  . Closed lumbar fx w/ cord inj 2010   hx lumbar fx  . Opiate abuse, continuous   . Reactive airways dysfunction syndrome    with illnesses  . Viral meningitis 7/15    Patient Active Problem List   Diagnosis Date Noted  . 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 Adjustment reaction 01/14/2016  . Screening for lipoid disorders 05/21/2015  . Meningitis, viral 03/13/2014  . Adverse effects of medication 09/05/2013  . ADD (attention deficit disorder) 06/28/2012  . Poor concentration 02/23/2012  . Routine general medical examination at a health care facility 02/12/2012  . Smoker 08/22/2011  . Chronic back pain 02/07/2010    Past Surgical History:  Procedure Laterality Date  . BACK SURGERY         Home Medications    Prior  to Admission medications   Medication Sig Start Date End Date Taking? Authorizing Provider  Aspirin-Salicylamide-Caffeine (BC HEADACHE) 325-95-16 MG TABS Take 1 each by mouth 2 (two) times daily as needed (for headache).    Yes Historical Provider, MD  buPROPion (WELLBUTRIN XL) 150 MG 24 hr tablet Take 150 mg by mouth daily. 05/15/16  Yes Historical Provider, MD  gabapentin (NEURONTIN) 300 MG capsule Take 1 capsule (300 mg total) by mouth 2 (two) times daily. 05/21/15  Yes Judy PimpleMarne A Tower, MD  methocarbamol (ROBAXIN) 500 MG tablet Take 1,000 mg by mouth every 4 (four) hours as needed for pain. 05/15/16  Yes Historical Provider, MD  omega-3 acid ethyl esters (LOVAZA) 1 g capsule Take 3 g by mouth daily.   Yes Historical Provider, MD  QUEtiapine (SEROQUEL) 200 MG tablet Take 200 mg by mouth daily. 05/15/16  Yes Historical Provider, MD  sertraline (ZOLOFT) 50 MG tablet Take 50 mg by mouth daily. 05/15/16  Yes Historical Provider, MD  cloNIDine (CATAPRES) 0.1 MG tablet Take 1 tablet (0.1 mg total) by mouth 2 (two) times daily. Patient not taking: Reported on 05/19/2016 03/14/16   Joycie PeekBenjamin Cartner, PA-C  cyclobenzaprine (FLEXERIL) 10 MG tablet TAKE ONE TABLET BY MOUTH 3 TIMES DAILY AS NEEDED Patient not taking: Reported on 05/19/2016 11/22/15   Judy PimpleMarne A Tower, MD  dicyclomine (BENTYL) 20 MG tablet Take 1 tablet (20 mg total) by mouth  2 (two) times daily. Patient not taking: Reported on 05/19/2016 03/14/16   Joycie Peek, PA-C  mirtazapine (REMERON) 15 MG tablet Take 1 tablet (15 mg total) by mouth at bedtime. Patient not taking: Reported on 05/19/2016 01/14/16   Judy Pimple, MD  ondansetron (ZOFRAN) 4 MG tablet Take 1 tablet (4 mg total) by mouth every 6 (six) hours. Patient not taking: Reported on 05/19/2016 03/14/16   Joycie Peek, PA-C    Family History Family History  Problem Relation Age of Onset  . Hypertension Mother   . Diabetes Mother     Social History Social History  Substance Use Topics  .  Smoking status: Current Every Day Smoker    Packs/day: 1.00    Types: Cigarettes  . Smokeless tobacco: Never Used  . Alcohol use 0.0 oz/week     Comment: rarely on holidays     Allergies   Chantix [varenicline tartrate]   Review of Systems Review of Systems  All other systems reviewed and are negative.    Physical Exam Updated Vital Signs BP 129/100 (BP Location: Right Arm)   Pulse 107   Temp 98.2 F (36.8 C) (Oral)   Resp 20   SpO2 100%   Physical Exam  Constitutional: He appears well-developed and well-nourished.  Sleepy but will wake up to verbal stimuli.    HENT:  Head: Normocephalic and atraumatic.  Mouth/Throat: Oropharynx is clear and moist.  Eyes: Conjunctivae and EOM are normal. Pupils are equal, round, and reactive to light.  Pupils are pinpoint  Neck: Normal range of motion. Neck supple.  Cardiovascular: Regular rhythm and intact distal pulses.  Tachycardia present.   No murmur heard. Pulmonary/Chest: Effort normal and breath sounds normal. No respiratory distress. He has no wheezes. He has no rales.  Abdominal: Soft. He exhibits no distension. There is no tenderness. There is no rebound and no guarding.  Musculoskeletal: Normal range of motion. He exhibits no edema or tenderness.  Small track mark on the left arm  Neurological:  somnolent  Skin: Skin is warm and dry. No rash noted. No erythema.  Psychiatric: He has a normal mood and affect. His behavior is normal.  Nursing note and vitals reviewed.    ED Treatments / Results  Labs (all labs ordered are listed, but only abnormal results are displayed) Labs Reviewed - No data to display  EKG  EKG Interpretation  Date/Time:  Monday May 19 2016 13:35:49 EDT Ventricular Rate:  96 PR Interval:    QRS Duration: 90 QT Interval:  362 QTC Calculation: 458 R Axis:   87 Text Interpretation:  Sinus rhythm No old tracing to compare Confirmed by Ethelda Chick  MD, SAM [phone]) on 05/19/2016 1:44:55 PM        Radiology No results found.  Procedures Procedures (including critical care time)  Medications Ordered in ED Medications  ondansetron (ZOFRAN) injection 4 mg (4 mg Intravenous Given 05/19/16 1445)  naloxone (NARCAN) injection 0.4 mg (0.4 mg Intravenous Given 05/19/16 1441)     Initial Impression / Assessment and Plan / ED Course  I have reviewed the triage vital signs and the nursing notes.  Pertinent labs & imaging results that were available during my care of the patient were reviewed by me and considered in my medical decision making (see chart for details).  Clinical Course   Patient with a history of heroin abuse who had been in a rehabilitation center for 30 days and had been clean until today when he used gray death.  Wife found him in his truck purple from the waist up and not breathing. She called EMS however by the time they arrived there patient was more lucid. Upon arrival here patient is drowsy but arousable. He has not been on any opiates for the last 30 days and was given Narcan with significant improvement in his symptoms. He denies that this was an attempt to commit suicide.  3:50 PM Pt has been much more awake since narcan.  Pt requesting imitrex for headache but ready to go home. Final Clinical Impressions(s) / ED Diagnoses   Final diagnoses:  Heroin overdose, accidental or unintentional, initial encounter  Other headache syndrome    New Prescriptions New Prescriptions   SUMATRIPTAN (IMITREX) 100 MG TABLET    Take 1 tablet (100 mg total) by mouth every 2 (two) hours as needed for migraine. May repeat in 2 hours if headache persists or recurs.     Gwyneth Sprout, MD 05/19/16 [id number]

## 2016-05-19 NOTE — ED Notes (Addendum)
Patient more alert after administration of Narcan. Breathing does not seem as labored. Family at bedside. Plunkett made aware.

## 2016-05-19 NOTE — ED Triage Notes (Addendum)
Per EMS pt was just in 30 day treatment for heroin, pt came home Saturday and then relapsed today. Was found in driveway by wife, pt was purple, responded to tactile stimulation, admits to using heroin and also taking gabapentin and robaxin. By the time first medic was on scene pt was standing smoking cigarette. No narcan was administered. C/o headache and chronic back pain. Pt denies trying to harm himself.

## 2016-05-22 ENCOUNTER — Emergency Department (HOSPITAL_COMMUNITY)
Admission: EM | Admit: 2016-05-22 | Discharge: 2016-05-22 | Disposition: A | Discharge status: Home / Self Care | Payer: Managed Care, Other (non HMO) | Attending: Emergency Medicine | Admitting: Emergency Medicine

## 2016-05-22 ENCOUNTER — Encounter (HOSPITAL_COMMUNITY): Payer: Self-pay

## 2016-05-22 ENCOUNTER — Emergency Department (HOSPITAL_COMMUNITY): Payer: Managed Care, Other (non HMO)

## 2016-05-22 DIAGNOSIS — T401X1A Poisoning by heroin, accidental (unintentional), initial encounter: Secondary | ICD-10-CM | POA: Diagnosis not present

## 2016-05-22 DIAGNOSIS — Z7982 Long term (current) use of aspirin: Secondary | ICD-10-CM | POA: Insufficient documentation

## 2016-05-22 DIAGNOSIS — F909 Attention-deficit hyperactivity disorder, unspecified type: Secondary | ICD-10-CM | POA: Diagnosis not present

## 2016-05-22 DIAGNOSIS — Z79899 Other long term (current) drug therapy: Secondary | ICD-10-CM | POA: Insufficient documentation

## 2016-05-22 DIAGNOSIS — F17210 Nicotine dependence, cigarettes, uncomplicated: Secondary | ICD-10-CM | POA: Insufficient documentation

## 2016-05-22 LAB — COMPREHENSIVE METABOLIC PANEL
ALBUMIN: 4.5 g/dL (ref 3.5–5.0)
ALT: 50 U/L (ref 17–63)
ANION GAP: 9 (ref 5–15)
AST: 38 U/L (ref 15–41)
Alkaline Phosphatase: 77 U/L (ref 38–126)
BUN: 12 mg/dL (ref 6–20)
CHLORIDE: 105 mmol/L (ref 101–111)
CO2: 26 mmol/L (ref 22–32)
Calcium: 9.6 mg/dL (ref 8.9–10.3)
Creatinine, Ser: 0.86 mg/dL (ref 0.61–1.24)
GFR calc Af Amer: >60 mL/min (ref >60)
GFR calc non Af Amer: >60 mL/min (ref >60)
GLUCOSE: 125 mg/dL — AB (ref 65–99)
POTASSIUM: 4.2 mmol/L (ref 3.5–5.1)
SODIUM: 140 mmol/L (ref 135–145)
TOTAL PROTEIN: 7.5 g/dL (ref 6.5–8.1)
Total Bilirubin: 0.9 mg/dL (ref 0.3–1.2)

## 2016-05-22 LAB — CBC WITH DIFFERENTIAL/PLATELET
BASOS ABS: 0 10*3/uL (ref 0.0–0.1)
Basophils Relative: 0 %
Eosinophils Absolute: 0 10*3/uL (ref 0.0–0.7)
Eosinophils Relative: 0 %
HEMATOCRIT: 42.5 % (ref 39.0–52.0)
Hemoglobin: 15 g/dL (ref 13.0–17.0)
LYMPHS ABS: 1.5 10*3/uL (ref 0.7–4.0)
LYMPHS PCT: 14 %
MCH: 30.2 pg (ref 26.0–34.0)
MCHC: 35.3 g/dL (ref 30.0–36.0)
MCV: 85.5 fL (ref 78.0–100.0)
MONO ABS: 0.6 10*3/uL (ref 0.1–1.0)
MONOS PCT: 5 %
NEUTROS ABS: 8.2 10*3/uL — AB (ref 1.7–7.7)
Neutrophils Relative %: 81 %
Platelets: 232 10*3/uL (ref 150–400)
RBC: 4.97 MIL/uL (ref 4.22–5.81)
RDW: 13 % (ref 11.5–15.5)
WBC: 10.3 10*3/uL (ref 4.0–10.5)

## 2016-05-22 LAB — ACETAMINOPHEN LEVEL

## 2016-05-22 LAB — ETHANOL: Alcohol, Ethyl (B): <5 mg/dL (ref <5)

## 2016-05-22 MED ORDER — ACETAMINOPHEN 500 MG PO TABS
1000.0000 mg | ORAL_TABLET | Freq: Once | ORAL | Status: AC
  Administered 2016-05-22: 1000 mg via ORAL
  Filled 2016-05-22: qty 2

## 2016-05-22 MED ORDER — SODIUM CHLORIDE 0.9 % IV BOLUS (SEPSIS)
1000.0000 mL | Freq: Once | INTRAVENOUS | Status: AC
  Administered 2016-05-22: 1000 mL via INTRAVENOUS

## 2016-05-22 MED ORDER — SODIUM CHLORIDE 0.9 % IV SOLN
INTRAVENOUS | Status: DC
  Administered 2016-05-22: 21:00:00 via INTRAVENOUS

## 2016-05-22 MED ORDER — SUMATRIPTAN SUCCINATE 6 MG/0.5ML ~~LOC~~ SOLN
6.0000 mg | Freq: Once | SUBCUTANEOUS | Status: AC
  Administered 2016-05-22: 6 mg via SUBCUTANEOUS
  Filled 2016-05-22: qty 0.5

## 2016-05-22 MED ORDER — NALOXONE HCL 0.4 MG/ML IJ SOLN: 0.4000 mg | INTRAMUSCULAR | Status: DC | PRN

## 2016-05-22 NOTE — ED Notes (Signed)
No respiratory or acute distress noted alert and oriented x 3 call light in reach visitor at bedside no reaction to medication noted. 

## 2016-05-22 NOTE — ED Triage Notes (Signed)
Overdose of heroin states EMS 2 one this week given 1mg  Narcan in route.

## 2016-05-22 NOTE — ED Notes (Signed)
No respiratory or acute distress noted alert and oriented x 3 no reaction to medication noted steady gait noted left with wife.

## 2016-05-22 NOTE — ED Notes (Signed)
Bed: WA13 Expected date:  Expected time:  Means of arrival:  Comments: EMS-OD 

## 2016-05-22 NOTE — ED Notes (Signed)
No respiratory or acute distress noted alert and oriented x 3 call light in reach. 

## 2016-05-22 NOTE — ED Notes (Signed)
Pt has try urinate not successful will try again

## 2016-05-22 NOTE — ED Provider Notes (Signed)
[facility]-EMERGENCY DEPT Provider Note   CSN: [id number] Arrival date & time: 05/22/16  1922     History   Chief Complaint Chief Complaint  Patient presents with  . Drug Overdose    HPI Jacob Newton is a 30 y.o. male.  Pt presents to the ED today with a heroin overdose.  The pt was here on 9/11 for a heroin overdose also.  The pt was just discharged from a 30 day drug detox program prior to the 9/11 visit.  The pt denies SI.  The pt was agonal upon 1st responder arrival.  The pt was given 1 mg of narcan en route.  He is now awake and alert, breathing w/o problems, but is shaking.      Past Medical History:  Diagnosis Date  . ADHD (attention deficit hyperactivity disorder)   . Back pain    chronic low back pain, with L%-S1 stenosis/bulge sisc/arthropathy  . Closed lumbar fx w/ cord inj 2010   hx lumbar fx  . Opiate abuse, continuous   . Reactive airways dysfunction syndrome    with illnesses  . Viral meningitis 7/15    Patient Active Problem List   Diagnosis Date Noted  . 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 Adjustment reaction 01/14/2016  . Screening for lipoid disorders 05/21/2015  . Meningitis, viral 03/13/2014  . Adverse effects of medication 09/05/2013  . ADD (attention deficit disorder) 06/28/2012  . Poor concentration 02/23/2012  . Routine general medical examination at a health care facility 02/12/2012  . Smoker 08/22/2011  . Chronic back pain 02/07/2010    Past Surgical History:  Procedure Laterality Date  . BACK SURGERY         Home Medications    Prior to Admission medications   Medication Sig Start Date End Date Taking? Authorizing Provider  Aspirin-Salicylamide-Caffeine (BC HEADACHE) 325-95-16 MG TABS Take 1 each by mouth 2 (two) times daily as needed (for headache).    Yes Historical Provider, MD  buPROPion (WELLBUTRIN XL) 150 MG 24 hr tablet Take 150 mg by mouth daily. 05/15/16  Yes Historical Provider, MD  gabapentin (NEURONTIN) 300 MG  capsule Take 1 capsule (300 mg total) by mouth 2 (two) times daily. 05/21/15  Yes Judy PimpleMarne A Tower, MD  methocarbamol (ROBAXIN) 500 MG tablet Take 1,000 mg by mouth every 4 (four) hours as needed for pain. 05/15/16  Yes Historical Provider, MD  omega-3 acid ethyl esters (LOVAZA) 1 g capsule Take 3 g by mouth daily.   Yes Historical Provider, MD  QUEtiapine (SEROQUEL) 200 MG tablet Take 200 mg by mouth daily. 05/15/16  Yes Historical Provider, MD  sertraline (ZOLOFT) 50 MG tablet Take 50 mg by mouth daily. 05/15/16  Yes Historical Provider, MD  SUMAtriptan (IMITREX) 100 MG tablet Take 1 tablet (100 mg total) by mouth every 2 (two) hours as needed for migraine. May repeat in 2 hours if headache persists or recurs. 05/19/16  Yes Gwyneth SproutWhitney Plunkett, MD  cloNIDine (CATAPRES) 0.1 MG tablet Take 1 tablet (0.1 mg total) by mouth 2 (two) times daily. Patient not taking: Reported on 05/22/2016 03/14/16   Joycie PeekBenjamin Cartner, PA-C  cyclobenzaprine (FLEXERIL) 10 MG tablet TAKE ONE TABLET BY MOUTH 3 TIMES DAILY AS NEEDED Patient not taking: Reported on 05/22/2016 11/22/15   Judy PimpleMarne A Tower, MD  dicyclomine (BENTYL) 20 MG tablet Take 1 tablet (20 mg total) by mouth 2 (two) times daily. Patient not taking: Reported on 05/22/2016 03/14/16   Joycie PeekBenjamin Cartner, PA-C  mirtazapine (REMERON)  15 MG tablet Take 1 tablet (15 mg total) by mouth at bedtime. Patient not taking: Reported on 05/22/2016 01/14/16   Judy Pimple, MD  ondansetron (ZOFRAN) 4 MG tablet Take 1 tablet (4 mg total) by mouth every 6 (six) hours. Patient not taking: Reported on 05/22/2016 03/14/16   Joycie Peek, PA-C    Family History Family History  Problem Relation Age of Onset  . Hypertension Mother   . Diabetes Mother     Social History Social History  Substance Use Topics  . Smoking status: Current Every Day Smoker    Packs/day: 1.00    Types: Cigarettes  . Smokeless tobacco: Never Used  . Alcohol use 0.0 oz/week     Comment: rarely on holidays      Allergies   Chantix [varenicline tartrate]   Review of Systems Review of Systems  Neurological: Positive for tremors.  All other systems reviewed and are negative.    Physical Exam Updated Vital Signs BP 139/92 (BP Location: Left Arm)   Pulse 116   Temp 97.7 F (36.5 C) (Oral)   Resp 23   Ht 5\' 9"  (1.753 m)   Wt 165 lb (74.8 kg)   SpO2 97%   BMI 24.37 kg/m   Physical Exam  Constitutional: He is oriented to person, place, and time. He appears well-developed and well-nourished.  HENT:  Head: Normocephalic and atraumatic.  Right Ear: External ear normal.  Left Ear: External ear normal.  Nose: Nose normal.  Mouth/Throat: Oropharynx is clear and moist.  Eyes: Conjunctivae and EOM are normal. Pupils are equal, round, and reactive to light.  Neck: Normal range of motion. Neck supple.  Cardiovascular: Regular rhythm, normal heart sounds and intact distal pulses.  Tachycardia present.   Pulmonary/Chest: Effort normal and breath sounds normal.  Abdominal: Soft. Bowel sounds are normal.  Musculoskeletal: Normal range of motion.  Neurological: He is alert and oriented to person, place, and time.  Skin: Skin is warm and dry.  Psychiatric: He has a normal mood and affect. His behavior is normal. Judgment and thought content normal.  Nursing note and vitals reviewed.    ED Treatments / Results  Labs (all labs ordered are listed, but only abnormal results are displayed) Labs Reviewed  ACETAMINOPHEN LEVEL - Abnormal; Notable for the following:       Result Value   Acetaminophen (Tylenol), Serum <10 (*)    All other components within normal limits  COMPREHENSIVE METABOLIC PANEL - Abnormal; Notable for the following:    Glucose, Bld 125 (*)    All other components within normal limits  CBC WITH DIFFERENTIAL/PLATELET - Abnormal; Notable for the following:    Neutro Abs 8.2 (*)    All other components within normal limits  ETHANOL  URINE RAPID DRUG SCREEN, HOSP  PERFORMED  URINALYSIS, ROUTINE W REFLEX MICROSCOPIC (NOT AT [facility])    EKG  EKG Interpretation None       Radiology Dg Chest Port 1 View  Result Date: 05/22/2016 CLINICAL DATA:  Heroin overdose, nausea EXAM: PORTABLE CHEST 1 VIEW COMPARISON:  None. FINDINGS: Portable semi-erect AP view of the chest. Small focus of atelectasis right base. Ill-defined hazy opacity left lung base. Heart size upper limits of normal. Pulmonary vascularity within normal limits. No pneumothorax. Bilateral nipple rings. IMPRESSION: Ill-defined hazy opacity in the left lung base could relate to atelectasis versus infiltrate. Small focus of atelectasis in the right lung base. Electronically Signed   By: Jasmine Pang M.D.   On:  05/22/2016 20:25    Procedures Procedures (including critical care time)  Medications Ordered in ED Medications  sodium chloride 0.9 % bolus 1,000 mL (0 mLs Intravenous Stopped 05/22/16 2028)    And  0.9 %  sodium chloride infusion ( Intravenous Stopped 05/22/16 2242)  naloxone [facility]) injection 0.4 mg (not administered)  acetaminophen (TYLENOL) tablet 1,000 mg (1,000 mg Oral Given 05/22/16 2033)  SUMAtriptan (IMITREX) injection 6 mg (6 mg Subcutaneous Given 05/22/16 2159)     Initial Impression / Assessment and Plan / ED Course  I have reviewed the triage vital signs and the nursing notes.  Pertinent labs & imaging results that were available during my care of the patient were reviewed by me and considered in my medical decision making (see chart for details).  Clinical Course   Pt has been observed for a number of hours.  He is stable for d/c.  He has family with him.  He is encouraged to avoid heroin.  He is given a Facilities manager on outpatient treatment programs.  Final Clinical Impressions(s) / ED Diagnoses   Final diagnoses:  Heroin overdose, accidental or unintentional, initial encounter    New Prescriptions New Prescriptions   No medications on file     Jacalyn Lefevre, MD 05/22/16 2243

## 2016-05-27 ENCOUNTER — Telehealth: Payer: Self-pay | Admitting: Family Medicine

## 2016-05-27 NOTE — Telephone Encounter (Signed)
Patient Name: Madalyn RobJOHNATHAN Rogacki  DOB: 08/02/1986    Initial Comment husband developed what looked like a bruise on friday. today it is large, purple, green and yellow, swollen around the bone areas, covers almost the entire calf. thrusday he did OD on heroin.    Nurse Assessment  Nurse: Renaldo FiddlerAdkins, RN, Raynelle FanningJulie Date/Time Lamount Cohen(Eastern Time): 05/27/2016 9:17:30 AM  Confirm and document reason for call. If symptomatic, describe symptoms. You must click the next button to save text entered. ---Caller states her husband developed what looked like a bruise on Friday. Today it is large, purple, green and yellow and covers almost all of his calf. States he did OD on [place] Thursday, EMS found him and he was given Narcan.  Has the patient traveled out of the country within the last 30 days? ---Not Applicable  Does the patient have any new or worsening symptoms? ---Yes  Will a triage be completed? ---Yes  Related visit to physician within the last 2 weeks? ---Yes  Does the PT have any chronic conditions? (i.e. diabetes, asthma, etc.) ---Yes  List chronic conditions. ---Hx Back Injury, Drug rehab until 05/15/16  Is this a behavioral health or substance abuse call? ---No     Guidelines    Guideline Title Affirmed Question Affirmed Notes  Leg Swelling and Edema [1] Thigh or calf pain AND [2] only 1 side AND [3] present > 1 hour    Final Disposition User   See Physician within 4 Hours (or PCP triage) Renaldo FiddlerAdkins, RN, Raynelle FanningJulie    Referrals  [facility] - ED   Disagree/Comply: Comply

## 2016-05-27 NOTE — Telephone Encounter (Signed)
Aware-agree with adv for eval

## 2016-06-13 ENCOUNTER — Ambulatory Visit: Payer: Managed Care, Other (non HMO)

## 2016-06-20 ENCOUNTER — Ambulatory Visit (INDEPENDENT_AMBULATORY_CARE_PROVIDER_SITE_OTHER): Payer: Managed Care, Other (non HMO) | Admitting: Family Medicine

## 2016-06-20 ENCOUNTER — Encounter (INDEPENDENT_AMBULATORY_CARE_PROVIDER_SITE_OTHER): Payer: Self-pay

## 2016-06-20 ENCOUNTER — Encounter: Payer: Self-pay | Admitting: Family Medicine

## 2016-06-20 VITALS — BP 106/68 | HR 86 | Temp 97.6°F | Ht 65.5 in | Wt 170.2 lb

## 2016-06-20 DIAGNOSIS — M549 Dorsalgia, unspecified: Secondary | ICD-10-CM

## 2016-06-20 DIAGNOSIS — G8929 Other chronic pain: Secondary | ICD-10-CM

## 2016-06-20 DIAGNOSIS — F1920 Other psychoactive substance dependence, uncomplicated: Secondary | ICD-10-CM | POA: Diagnosis not present

## 2016-06-20 DIAGNOSIS — G43909 Migraine, unspecified, not intractable, without status migrainosus: Secondary | ICD-10-CM | POA: Insufficient documentation

## 2016-06-20 DIAGNOSIS — F17200 Nicotine dependence, unspecified, uncomplicated: Secondary | ICD-10-CM | POA: Diagnosis not present

## 2016-06-20 DIAGNOSIS — Z23 Encounter for immunization: Secondary | ICD-10-CM | POA: Diagnosis not present

## 2016-06-20 MED ORDER — METHOCARBAMOL 500 MG PO TABS: 1000.0000 mg | ORAL_TABLET | ORAL | 3 refills | Status: DC | PRN

## 2016-06-20 MED ORDER — SUMATRIPTAN SUCCINATE 100 MG PO TABS: 100.0000 mg | ORAL_TABLET | ORAL | 11 refills | Status: DC | PRN

## 2016-06-20 NOTE — Assessment & Plan Note (Signed)
He is now off narcotics after drug addiction -in rehab  Hx of closed lumbar fx /cord injury  Is currently taking 400 mg of gabapentin and 1000 of Robaxin on a schedule with good control of pain  Also sleeping and eating better   Refilled robaxin today  Gabapentin will continue to come from the [facility] until he is done with his rehab

## 2016-06-20 NOTE — Assessment & Plan Note (Signed)
Disc in detail risks of smoking and possible outcomes including copd, vascular/ heart disease, cancer , respiratory and sinus infections  Pt voices understanding He is still in intensive outpt rehab for narcotic addiction and will focus on that first before smoking cessation

## 2016-06-20 NOTE — Patient Instructions (Signed)
Drink lots of fluids  Start backing off on caffeine a little - it can perpetuate migraines  Robaxin for back spasms  Continue treatment at the [facility]- I'm glad that is doing well   Take care of yourself  Flu shot today

## 2016-06-20 NOTE — Assessment & Plan Note (Signed)
Pt continues imitrex prn  Hope frequency will decrease as he gets further away from narcotics Disc sleep and diet  Urged to begin to wean off of caffeine when ready  Enc good hydration

## 2016-06-20 NOTE — Progress Notes (Signed)
Pre visit review using our clinic review tool, if applicable. No additional management support is needed unless otherwise documented below in the visit note. 

## 2016-06-20 NOTE — Progress Notes (Signed)
Subjective:    Patient ID: Jacob Newton, male    DOB: 07/13/1986, 30 y.o.   MRN: [id number]  HPI Here for f/u of chronic medical problems   Pt has struggled with a narcotic/heroin addiction with multiple OD since last visit  He is going to [facility]- doing a suboxone taper and he is doing better  Goes three times per week/- outpatient intensive therapy   Has to stay away from his old drug contacts  He is going to [place] to fix cell Towers- in 6-10 days    Wt Readings from Last 3 Encounters:  06/20/16 170 lb 4 oz (77.2 kg)  05/22/16 165 lb (74.8 kg)  03/14/16 160 lb (72.6 kg)   bmi is 27.9  Mood and appetite are good  Living at home   Flu shot today   For back pain = cannot take any narcotics  Robaxin 1000 mg 4 times daily  Needs that refilled  No side eff or sleepiness   Took him off of wellbutrin  Was on clonidine to get through wd -off of it now  imitrex- has for bad migraines  Still getting migraines about twice per week  Have not really changed a lot   Off mirtazapine and on zoloft now - the doctors at [facility] will continue to px that until he is done there   8-12 weeks   Chronic back pain - has been greatly helped by the Robaxin- mostly spasms   Drinking a lot of fluids  Caffeine - about 2 (20 oz) of soda per day  8-10 hours of sleep per night  Sleeping ok   Mood is much better  No hopelessness or sadness  Has moments of anxiety- distracts himself  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 Migraine syndrome 06/20/2016  . 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 Adjustment reaction 01/14/2016  . Screening for lipoid disorders 05/21/2015  . Adverse effects of medication 09/05/2013  . ADD (attention deficit disorder) 06/28/2012  . Poor concentration 02/23/2012  . Routine general medical  examination at a health care facility 02/12/2012  . Smoker 08/22/2011  . Chronic back pain 02/07/2010   Past Medical History:  Diagnosis Date  . ADHD (attention deficit hyperactivity disorder)   . Back pain    chronic low back pain, with L%-S1 stenosis/bulge sisc/arthropathy  . Closed lumbar fx w/ cord inj 2010   hx lumbar fx  . Opiate abuse, continuous   . Reactive airways dysfunction syndrome    with illnesses  . Viral meningitis 7/15   Past Surgical History:  Procedure Laterality Date  . BACK SURGERY     Social History  Substance Use Topics  . Smoking status: Current Every Day Smoker    Packs/day: 1.00    Types: Cigarettes  . Smokeless tobacco: Never Used  . Alcohol use No     Comment: rarely on holidays   Family History  Problem Relation Age of Onset  . Hypertension Mother   . Diabetes Mother    Allergies  Allergen Reactions  . Chantix [Varenicline Tartrate] Other (See Comments)    Bad dreams   Current Outpatient Prescriptions on File Prior to Visit  Medication Sig Dispense Refill  . Aspirin-Salicylamide-Caffeine (BC HEADACHE) 325-95-16 MG TABS Take 1 each by  mouth 2 (two) times daily as needed (for headache).     Marland Kitchen omega-3 acid ethyl esters (LOVAZA) 1 g capsule Take 3 g by mouth daily.    . QUEtiapine (SEROQUEL) 200 MG tablet Take 200 mg by mouth daily.    . sertraline (ZOLOFT) 50 MG tablet Take 50 mg by mouth daily.     No current facility-administered medications on file prior to visit.     Review of Systems    Review of Systems  Constitutional: Negative for fever, appetite change, fatigue and unexpected weight change.  Eyes: Negative for pain and visual disturbance.  Respiratory: Negative for cough and shortness of breath.   Cardiovascular: Negative for cp or palpitations    Gastrointestinal: Negative for nausea, diarrhea and constipation.  Genitourinary: Negative for urgency and frequency.  Skin: Negative for pallor or rash   MSK pos for chronic back  pain - low back/ R side worst /no neuro symptoms Neurological: Negative for weakness, light-headedness, numbness and headaches.  Hematological: Negative for adenopathy. Does not bruise/bleed easily.  Psychiatric/Behavioral: Negative for dysphoric mood. The patient is not nervous/anxious.      Objective:   Physical Exam  Constitutional: He appears well-developed and well-nourished. No distress.  Well appearing -much improved  HENT:  Head: Normocephalic and atraumatic.  Mouth/Throat: Oropharynx is clear and moist.  Eyes: Conjunctivae and EOM are normal. Pupils are equal, round, and reactive to light.  Neck: Normal range of motion. Neck supple. No JVD present. Carotid bruit is not present. No thyromegaly present.  Cardiovascular: Normal rate, regular rhythm, normal heart sounds and intact distal pulses.  Exam reveals no gallop.   Pulmonary/Chest: Effort normal and breath sounds normal. No respiratory distress. He has no wheezes. He has no rales.  No crackles  Diffusely distant bs   Abdominal: Soft. Bowel sounds are normal. He exhibits no distension, no abdominal bruit and no mass. There is no tenderness.  Musculoskeletal: He exhibits no edema.  Tender over LS and R Lumbar musculature  Nl rom  Neg SLR Nl gait   Lymphadenopathy:    He has no cervical adenopathy.  Neurological: He is alert. He has normal reflexes. He displays no tremor. No cranial nerve deficit. He exhibits normal muscle tone. Coordination normal.  Skin: Skin is warm and dry. No rash noted.  Psychiatric: He has a normal mood and affect.  Talkative and engaged  Much improved from previous           Assessment & Plan:   Problem List Items Addressed This Visit      Cardiovascular and Mediastinum   Migraine syndrome    Pt continues imitrex prn  Hope frequency will decrease as he gets further away from narcotics Disc sleep and diet  Urged to begin to wean off of caffeine when ready  Enc good hydration        Relevant Medications   gabapentin (NEURONTIN) 400 MG capsule   methocarbamol (ROBAXIN) 500 MG tablet   SUMAtriptan (IMITREX) 100 MG tablet     Other   Addiction to drug (HCC) - Primary    Pt is doing much better (after 3 OD) He is currently at the [facility] for intensive outpt rehab and is fully over detox-back to work and feeling better On a suboxone program (tapering)  He plans to finish their program  On seroquel and zoloft which help mood/appetite and sleep Also lovaza         Chronic back pain    He  is now off narcotics after drug addiction -in rehab  Hx of closed lumbar fx /cord injury  Is currently taking 400 mg of gabapentin and 1000 of Robaxin on a schedule with good control of pain  Also sleeping and eating better   Refilled robaxin today  Gabapentin will continue to come from the [facility] until he is done with his rehab       Relevant Medications   methocarbamol (ROBAXIN) 500 MG tablet   Smoker    Disc in detail risks of smoking and possible outcomes including copd, vascular/ heart disease, cancer , respiratory and sinus infections  Pt voices understanding He is still in intensive outpt rehab for narcotic addiction and will focus on that first before smoking cessation       Other Visit Diagnoses    Need for influenza vaccination       Relevant Orders   Flu Vaccine QUAD 36+ mos IM (Completed)

## 2016-06-20 NOTE — Assessment & Plan Note (Signed)
Pt is doing much better (after 3 OD) He is currently at the [facility] for intensive outpt rehab and is fully over detox-back to work and feeling better On a suboxone program (tapering)  He plans to finish their program  On seroquel and zoloft which help mood/appetite and sleep Also lovaza

## 2016-07-30 ENCOUNTER — Other Ambulatory Visit: Payer: Self-pay | Admitting: Family Medicine

## 2016-07-30 NOTE — Telephone Encounter (Signed)
I can't tell the last time you filled this Rx, please advise

## 2016-07-30 NOTE — Telephone Encounter (Signed)
[facility] was doing it for a while and now it is back to me - however the dosing was different I think  Please call him and verfy his dosing  Then refills for 6 mo are ok  Thanks

## 2016-08-05 ENCOUNTER — Emergency Department (HOSPITAL_COMMUNITY): Payer: Managed Care, Other (non HMO)

## 2016-08-05 ENCOUNTER — Telehealth: Payer: Self-pay | Admitting: Family Medicine

## 2016-08-05 ENCOUNTER — Emergency Department (HOSPITAL_COMMUNITY)
Admission: EM | Admit: 2016-08-05 | Discharge: 2016-08-05 | Disposition: A | Discharge status: Home / Self Care | Payer: Managed Care, Other (non HMO) | Attending: Emergency Medicine | Admitting: Emergency Medicine

## 2016-08-05 ENCOUNTER — Encounter (HOSPITAL_COMMUNITY): Payer: Self-pay | Admitting: *Deleted

## 2016-08-05 DIAGNOSIS — F909 Attention-deficit hyperactivity disorder, unspecified type: Secondary | ICD-10-CM | POA: Insufficient documentation

## 2016-08-05 DIAGNOSIS — Y92009 Unspecified place in unspecified non-institutional (private) residence as the place of occurrence of the external cause: Secondary | ICD-10-CM | POA: Insufficient documentation

## 2016-08-05 DIAGNOSIS — S0990XA Unspecified injury of head, initial encounter: Secondary | ICD-10-CM | POA: Insufficient documentation

## 2016-08-05 DIAGNOSIS — W1839XA Other fall on same level, initial encounter: Secondary | ICD-10-CM | POA: Insufficient documentation

## 2016-08-05 DIAGNOSIS — F17210 Nicotine dependence, cigarettes, uncomplicated: Secondary | ICD-10-CM | POA: Insufficient documentation

## 2016-08-05 DIAGNOSIS — Z7982 Long term (current) use of aspirin: Secondary | ICD-10-CM | POA: Insufficient documentation

## 2016-08-05 DIAGNOSIS — F1110 Opioid abuse, uncomplicated: Secondary | ICD-10-CM | POA: Insufficient documentation

## 2016-08-05 DIAGNOSIS — Y999 Unspecified external cause status: Secondary | ICD-10-CM | POA: Diagnosis not present

## 2016-08-05 DIAGNOSIS — Y939 Activity, unspecified: Secondary | ICD-10-CM | POA: Insufficient documentation

## 2016-08-05 LAB — CBC
HCT: 43.3 % (ref 39.0–52.0)
HEMOGLOBIN: 15.7 g/dL (ref 13.0–17.0)
MCH: 30.8 pg (ref 26.0–34.0)
MCHC: 36.3 g/dL — AB (ref 30.0–36.0)
MCV: 84.9 fL (ref 78.0–100.0)
PLATELETS: 261 10*3/uL (ref 150–400)
RBC: 5.1 MIL/uL (ref 4.22–5.81)
RDW: 12.6 % (ref 11.5–15.5)
WBC: 7.2 10*3/uL (ref 4.0–10.5)

## 2016-08-05 LAB — BASIC METABOLIC PANEL
Anion gap: 7 (ref 5–15)
BUN: 10 mg/dL (ref 6–20)
CALCIUM: 9.4 mg/dL (ref 8.9–10.3)
CO2: 29 mmol/L (ref 22–32)
CREATININE: 0.95 mg/dL (ref 0.61–1.24)
Chloride: 104 mmol/L (ref 101–111)
GFR calc Af Amer: >60 mL/min (ref >60)
GFR calc non Af Amer: >60 mL/min (ref >60)
GLUCOSE: 104 mg/dL — AB (ref 65–99)
Potassium: 3.4 mmol/L — ABNORMAL LOW (ref 3.5–5.1)
Sodium: 140 mmol/L (ref 135–145)

## 2016-08-05 MED ORDER — SUMATRIPTAN SUCCINATE 100 MG PO TABS
100.0000 mg | ORAL_TABLET | ORAL | Status: DC | PRN
  Administered 2016-08-05: 100 mg via ORAL
  Filled 2016-08-05: qty 1

## 2016-08-05 NOTE — Discharge Instructions (Signed)
Tylenol or motrin for headache. Avoid drug use. Follow up with a family doctor as needed.

## 2016-08-05 NOTE — Telephone Encounter (Signed)
FYI: see team health note below.  Patient going to ER via EMS.

## 2016-08-05 NOTE — ED Provider Notes (Signed)
[facility]-EMERGENCY DEPT Provider Note   CSN: [id number] Arrival date & time: 08/05/16  1704     History   Chief Complaint Chief Complaint  Patient presents with  . Fall    HPI Jacob Newton is a 30 y.o. male.  HPI Jacob Newton is a 30 y.o. male with history of chronic back pain, history of lumbar fractures, history of opiate abuse, presents to emergency department complaining of a headache. Patient apparently fell yesterday after an overdose on heroin, and hit his right side of the head possibly on the sink. Wife found patient in the bathroom, states he was "blue and not breathing." She administered 1 mg of Narcan into his eye. Patient became more arousable, EMS was called. EMS administered 5 more milligrams of Narcan. Patient was fully awake after that. Patient has had persistent headaches since this incident, and has had for more falls. He has had nausea and vomiting. Dizziness. He states that he feels "shaky." His headache persisted throughout today and he came to the ED for further evaluation. He denies any neck pain. No numbness or weakness in his extremities. He is ambulatory. He has not used since then.  Past Medical History:  Diagnosis Date  . ADHD (attention deficit hyperactivity disorder)   . Back pain    chronic low back pain, with L%-S1 stenosis/bulge sisc/arthropathy  . Closed lumbar fx w/ cord inj 2010   hx lumbar fx  . Opiate abuse, continuous   . Reactive airways dysfunction syndrome    with illnesses  . Viral meningitis 7/15    Patient Active Problem List   Diagnosis Date Noted  . Migraine syndrome 06/20/2016  . 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 Adjustment reaction 01/14/2016  . Screening for lipoid disorders 05/21/2015  . Adverse effects of medication 09/05/2013  . ADD (attention deficit disorder) 06/28/2012  . Poor concentration 02/23/2012  . Routine general medical examination at a health care facility 02/12/2012  . Smoker 08/22/2011  .  Chronic back pain 02/07/2010    Past Surgical History:  Procedure Laterality Date  . BACK SURGERY         Home Medications    Prior to Admission medications   Medication Sig Start Date End Date Taking? Authorizing Provider  amphetamine-dextroamphetamine (ADDERALL) 20 MG tablet Take 20 mg by mouth 2 (two) times daily.   Yes Historical Provider, MD  Aspirin-Salicylamide-Caffeine (BC HEADACHE) 325-95-16 MG TABS Take 1 each by mouth 2 (two) times daily as needed (for headache).    Yes Historical Provider, MD  gabapentin (NEURONTIN) 400 MG capsule Take 400 mg by mouth 4 (four) times daily.   Yes Historical Provider, MD  methocarbamol (ROBAXIN) 500 MG tablet Take 2 tablets (1,000 mg total) by mouth every 4 (four) hours as needed. Patient taking differently: Take 1,000 mg by mouth every 4 (four) hours as needed for muscle spasms.  06/20/16  Yes Judy Pimple, MD  QUEtiapine (SEROQUEL) 200 MG tablet Take 200 mg by mouth at bedtime.  05/15/16  Yes Historical Provider, MD  SUMAtriptan (IMITREX) 100 MG tablet Take 1 tablet (100 mg total) by mouth every 2 (two) hours as needed for migraine. May repeat in 2 hours if headache persists or recurs. 06/20/16  Yes Judy Pimple, MD    Family History Family History  Problem Relation Age of Onset  . Hypertension Mother   . Diabetes Mother     Social History Social History  Substance Use Topics  . Smoking status: Current  Every Day Smoker    Packs/day: 1.00    Types: Cigarettes  . Smokeless tobacco: Never Used  . Alcohol use No     Comment: rarely on holidays     Allergies   Chantix [varenicline tartrate]   Review of Systems Review of Systems  Constitutional: Negative for chills and fever.  Eyes: Negative for visual disturbance.  Respiratory: Negative for cough, chest tightness and shortness of breath.   Cardiovascular: Negative for chest pain, palpitations and leg swelling.  Gastrointestinal: Positive for nausea and vomiting. Negative  for abdominal distention, abdominal pain and diarrhea.  Genitourinary: Negative for dysuria, frequency, hematuria and urgency.  Musculoskeletal: Negative for arthralgias, myalgias, neck pain and neck stiffness.  Skin: Negative for rash.  Allergic/Immunologic: Negative for immunocompromised state.  Neurological: Positive for tremors and headaches. Negative for dizziness, weakness, light-headedness and numbness.  All other systems reviewed and are negative.    Physical Exam Updated Vital Signs BP (!) 136/103   Pulse 105   Temp 98.4 F (36.9 C)   Resp 23   SpO2 99%   Physical Exam  Constitutional: He is oriented to person, place, and time. He appears well-developed and well-nourished. No distress.  HENT:  Head: Normocephalic and atraumatic.  No hemotympanum bilaterally  Eyes: Conjunctivae and EOM are normal. Pupils are equal, round, and reactive to light.  Neck: Normal range of motion. Neck supple.  No cervical spine tenderness, full range of motion of the neck  Cardiovascular: Normal rate, regular rhythm and normal heart sounds.   Pulmonary/Chest: Effort normal. No respiratory distress. He has no wheezes. He has no rales.  Abdominal: Soft. Bowel sounds are normal. He exhibits no distension. There is no tenderness. There is no rebound.  Musculoskeletal: Normal range of motion. He exhibits no edema.  Neurological: He is alert and oriented to person, place, and time.  5/5 and equal upper and lower extremity strength bilaterally. Equal grip strength bilaterally. Normal finger to nose and heel to shin. No pronator drift. Patellar reflexes 2+   Skin: Skin is warm and dry.  Nursing note and vitals reviewed.    ED Treatments / Results  Labs (all labs ordered are listed, but only abnormal results are displayed) Labs Reviewed  BASIC METABOLIC PANEL - Abnormal; Notable for the following:       Result Value   Potassium 3.4 (*)    Glucose, Bld 104 (*)    All other components within  normal limits  CBC - Abnormal; Notable for the following:    MCHC 36.3 (*)    All other components within normal limits    EKG  EKG Interpretation None       Radiology Ct Head Wo Contrast  Result Date: 08/05/2016 CLINICAL DATA:  30 year old male status post fall at home striking head. Heroin overdose, reversed with narcan. Dizziness, numbness, blurred vision and tremor. Initial encounter. EXAM: CT HEAD WITHOUT CONTRAST CT CERVICAL SPINE WITHOUT CONTRAST TECHNIQUE: Multidetector CT imaging of the head and cervical spine was performed following the standard protocol without intravenous contrast. Multiplanar CT image reconstructions of the cervical spine were also generated. COMPARISON:  Cervical spine radiographs 10/05/2015. Head CT without contrast 03/11/2014. FINDINGS: CT HEAD FINDINGS Brain: Cerebral volume remains normal. No midline shift, ventriculomegaly, mass effect, evidence of mass lesion, intracranial hemorrhage or evidence of cortically based acute infarction. Gray-white matter differentiation is within normal limits throughout the brain. Vascular: No suspicious intracranial vascular hyperdensity. Skull: No skull fracture. Sinuses/Orbits: Visualized paranasal sinuses and mastoids are stable  and well pneumatized. Other: Disc conjugate gaze. Otherwise negative orbits soft tissues. No scalp hematoma. CT CERVICAL SPINE FINDINGS Alignment: Chronic straightening of cervical lordosis. Cervicothoracic junction alignment is within normal limits. Bilateral posterior element alignment is within normal limits. Skull base and vertebrae: Visualized skull base is intact. No atlanto-occipital dissociation. No cervical spine fracture. Soft tissues and spinal canal: No prevertebral fluid or swelling. No visible canal hematoma. Negative noncontrast neck soft tissues. Disc levels:  No significant degenerative changes. Upper chest: Ununited ossification center of the T1 spinous process. Visible upper thoracic  levels appear intact. Negative lung apices. IMPRESSION: 1. Stable and normal noncontrast CT appearance of the brain. 2.  No acute fracture or listhesis in the cervical spine. Electronically Signed   By: Odessa FlemingH  Hall M.D.   On: 08/05/2016 19:08   Ct Cervical Spine Wo Contrast  Result Date: 08/05/2016 CLINICAL DATA:  30 year old male status post fall at home striking head. Heroin overdose, reversed with narcan. Dizziness, numbness, blurred vision and tremor. Initial encounter. EXAM: CT HEAD WITHOUT CONTRAST CT CERVICAL SPINE WITHOUT CONTRAST TECHNIQUE: Multidetector CT imaging of the head and cervical spine was performed following the standard protocol without intravenous contrast. Multiplanar CT image reconstructions of the cervical spine were also generated. COMPARISON:  Cervical spine radiographs 10/05/2015. Head CT without contrast 03/11/2014. FINDINGS: CT HEAD FINDINGS Brain: Cerebral volume remains normal. No midline shift, ventriculomegaly, mass effect, evidence of mass lesion, intracranial hemorrhage or evidence of cortically based acute infarction. Gray-white matter differentiation is within normal limits throughout the brain. Vascular: No suspicious intracranial vascular hyperdensity. Skull: No skull fracture. Sinuses/Orbits: Visualized paranasal sinuses and mastoids are stable and well pneumatized. Other: Disc conjugate gaze. Otherwise negative orbits soft tissues. No scalp hematoma. CT CERVICAL SPINE FINDINGS Alignment: Chronic straightening of cervical lordosis. Cervicothoracic junction alignment is within normal limits. Bilateral posterior element alignment is within normal limits. Skull base and vertebrae: Visualized skull base is intact. No atlanto-occipital dissociation. No cervical spine fracture. Soft tissues and spinal canal: No prevertebral fluid or swelling. No visible canal hematoma. Negative noncontrast neck soft tissues. Disc levels:  No significant degenerative changes. Upper chest: Ununited  ossification center of the T1 spinous process. Visible upper thoracic levels appear intact. Negative lung apices. IMPRESSION: 1. Stable and normal noncontrast CT appearance of the brain. 2.  No acute fracture or listhesis in the cervical spine. Electronically Signed   By: Odessa FlemingH  Hall M.D.   On: 08/05/2016 19:08    Procedures Procedures (including critical care time)  Medications Ordered in ED Medications  SUMAtriptan (IMITREX) tablet 100 mg (not administered)     Initial Impression / Assessment and Plan / ED Course  I have reviewed the triage vital signs and the nursing notes.  Pertinent labs & imaging results that were available during my care of the patient were reviewed by me and considered in my medical decision making (see chart for details).  Clinical Course   Patient in emergency department after heroin overdose which occurred yesterday, and 5 falls, each time hitting his head. Questionable loss of consciousness. Patient has had dizziness, nausea, vomiting, headache since then. Will get CT head. Labs ordered by ran at triage. Patient has normal neurological exam. He is asking for Imitrex for his headache.  7:37 PM Patient CT scans are negative. Results discussed with him and his significant other. Question concussion. Advised home with tylenol/motrin for headache, avoid drugs, follow up as needed.   Vitals:   08/05/16 1712  BP: (!) 136/103  Pulse: 105  Resp: 23  Temp: 98.4 F (36.9 C)  SpO2: 99%     Final Clinical Impressions(s) / ED Diagnoses   Final diagnoses:  Injury of head, initial encounter  Heroin abuse    New Prescriptions New Prescriptions   No medications on file     Jaynie Crumbleatyana Avondre Richens, PA-C 08/06/16 0130    Vanetta MuldersScott Zackowski, MD 08/07/16 [phone]

## 2016-08-05 NOTE — Telephone Encounter (Signed)
It looks like he is in the ED now-thanks

## 2016-08-05 NOTE — ED Triage Notes (Signed)
PT RECEIVED FROM HOME VIA EMS FOR A FALL. PER EMS THE PT OVERDOSED ON HEROINE LAST NIGHT AND FELL, HITTING HIS HEAD. THE FAMILY GAVE HIM NARCAN IN THE EYE, AND HE REGAINED CONSCIOUSNESS, THEN REFUSED TRANSPORT OT THE HOSPITAL. PT C/O NUMBNESS, DIZZINESS, TREMORS, AND BLURRED VISION. PT STS HE RELAPSED YESTERDAY AFTER BEING CLEAN FOR A 1 1/2 MONTHS.

## 2016-08-05 NOTE — Telephone Encounter (Signed)
Nurse Assessment  Nurse: Katrinka BlazingSmith, RN, Cala BradfordKimberly Date/Time Jacob Newton(Eastern Time): 08/05/2016 3:45:17 PM  Confirm and document reason for call. If symptomatic, describe symptoms. You must click the next button to save text entered. ---Caller states fell and hit his head three times last night. Right side of head is numb and has a knot above his eye.  Does the patient have any new or worsening symptoms? ---Yes  Will a triage be completed? ---Yes  Related visit to physician within the last 2 weeks? ---No  Does the PT have any chronic conditions? (i.e. diabetes, asthma, etc.) ---No  Is this a behavioral health or substance abuse call? ---No     Guidelines      Guideline Title Affirmed Question Affirmed Notes Nurse Date/Time (Eastern Time)  Head Injury [1] ACUTE NEURO SYMPTOM AND [2] present now (DEFINITION: difficult to awaken OR confused thinking and talking OR slurred speech OR weakness of arms OR unsteady walking)  Katrinka BlazingSmith, RN, Cala BradfordKimberly 08/05/2016 3:46:18 PM   Disp. Time Jacob Newton(Eastern Time) Disposition Final User   08/05/2016 3:42:58 PM Send to Urgent Leonie DouglasQueue  Smith, Alyssa   08/05/2016 3:56:03 PM 911 Outcome Documentation  Katrinka BlazingSmith, RN, Cala BradfordKimberly    Reason: 911 Outcome Documentation: Caller states he is calling right now because he "went to the gas station." Stressed importance of calling 911 now and getting checked out. Caller verbalized understanding.     08/05/2016 3:57:46 PM Send To RN Personal  Katrinka BlazingSmith, RN, Cala BradfordKimberly    08/05/2016 3:47:48 PM Call EMS 911 Now Yes Katrinka BlazingSmith, RN, Anders SimmondsKimberly        Caller Understands: Yes  Disagree/Comply: Comply

## 2016-08-09 ENCOUNTER — Encounter (HOSPITAL_COMMUNITY): Payer: Self-pay | Admitting: *Deleted

## 2016-08-09 ENCOUNTER — Emergency Department (HOSPITAL_COMMUNITY)
Admission: EM | Admit: 2016-08-09 | Discharge: 2016-08-09 | Disposition: A | Discharge status: Other Acute Inpatient Hospital | Payer: Managed Care, Other (non HMO) | Attending: Emergency Medicine | Admitting: Emergency Medicine

## 2016-08-09 DIAGNOSIS — Z7982 Long term (current) use of aspirin: Secondary | ICD-10-CM | POA: Insufficient documentation

## 2016-08-09 DIAGNOSIS — F909 Attention-deficit hyperactivity disorder, unspecified type: Secondary | ICD-10-CM | POA: Insufficient documentation

## 2016-08-09 DIAGNOSIS — F17210 Nicotine dependence, cigarettes, uncomplicated: Secondary | ICD-10-CM | POA: Insufficient documentation

## 2016-08-09 DIAGNOSIS — F1110 Opioid abuse, uncomplicated: Secondary | ICD-10-CM | POA: Insufficient documentation

## 2016-08-09 DIAGNOSIS — R45851 Suicidal ideations: Secondary | ICD-10-CM

## 2016-08-09 DIAGNOSIS — F329 Major depressive disorder, single episode, unspecified: Secondary | ICD-10-CM | POA: Insufficient documentation

## 2016-08-09 DIAGNOSIS — Z79899 Other long term (current) drug therapy: Secondary | ICD-10-CM | POA: Insufficient documentation

## 2016-08-09 LAB — COMPREHENSIVE METABOLIC PANEL
ALT: 32 U/L (ref 17–63)
AST: 22 U/L (ref 15–41)
Albumin: 4.8 g/dL (ref 3.5–5.0)
Alkaline Phosphatase: 73 U/L (ref 38–126)
Anion gap: 8 (ref 5–15)
BILIRUBIN TOTAL: 1.2 mg/dL (ref 0.3–1.2)
BUN: 11 mg/dL (ref 6–20)
CO2: 32 mmol/L (ref 22–32)
CREATININE: 0.95 mg/dL (ref 0.61–1.24)
Calcium: 9.4 mg/dL (ref 8.9–10.3)
Chloride: 100 mmol/L — ABNORMAL LOW (ref 101–111)
Glucose, Bld: 89 mg/dL (ref 65–99)
POTASSIUM: 3.4 mmol/L — AB (ref 3.5–5.1)
Sodium: 140 mmol/L (ref 135–145)
TOTAL PROTEIN: 7.6 g/dL (ref 6.5–8.1)

## 2016-08-09 LAB — CBC
HCT: 47.9 % (ref 39.0–52.0)
HEMOGLOBIN: 16.5 g/dL (ref 13.0–17.0)
MCH: 30.1 pg (ref 26.0–34.0)
MCHC: 34.4 g/dL (ref 30.0–36.0)
MCV: 87.2 fL (ref 78.0–100.0)
Platelets: 277 10*3/uL (ref 150–400)
RBC: 5.49 MIL/uL (ref 4.22–5.81)
RDW: 12.7 % (ref 11.5–15.5)
WBC: 9 10*3/uL (ref 4.0–10.5)

## 2016-08-09 LAB — ETHANOL

## 2016-08-09 LAB — RAPID URINE DRUG SCREEN, HOSP PERFORMED
Amphetamines: POSITIVE — AB
Barbiturates: NOT DETECTED
Benzodiazepines: NOT DETECTED
COCAINE: POSITIVE — AB
OPIATES: NOT DETECTED
Tetrahydrocannabinol: NOT DETECTED

## 2016-08-09 MED ORDER — ZOLPIDEM TARTRATE 5 MG PO TABS: 5.0000 mg | ORAL_TABLET | Freq: Every evening | ORAL | Status: DC | PRN

## 2016-08-09 MED ORDER — NICOTINE 21 MG/24HR TD PT24
21.0000 mg | MEDICATED_PATCH | Freq: Every day | TRANSDERMAL | Status: DC | PRN
  Administered 2016-08-09: 21 mg via TRANSDERMAL
  Filled 2016-08-09: qty 1

## 2016-08-09 MED ORDER — ONDANSETRON HCL 4 MG PO TABS: 4.0000 mg | ORAL_TABLET | Freq: Three times a day (TID) | ORAL | Status: DC | PRN

## 2016-08-09 MED ORDER — GABAPENTIN 400 MG PO CAPS: 400.0000 mg | ORAL_CAPSULE | Freq: Four times a day (QID) | ORAL | Status: DC

## 2016-08-09 MED ORDER — ACETAMINOPHEN 325 MG PO TABS: 650.0000 mg | ORAL_TABLET | ORAL | Status: DC | PRN

## 2016-08-09 MED ORDER — AMPHETAMINE-DEXTROAMPHETAMINE 20 MG PO TABS: 20.0000 mg | ORAL_TABLET | Freq: Two times a day (BID) | ORAL | Status: DC

## 2016-08-09 MED ORDER — IBUPROFEN 200 MG PO TABS: 600.0000 mg | ORAL_TABLET | Freq: Three times a day (TID) | ORAL | Status: DC | PRN

## 2016-08-09 MED ORDER — POTASSIUM CHLORIDE CRYS ER 20 MEQ PO TBCR
40.0000 meq | EXTENDED_RELEASE_TABLET | Freq: Once | ORAL | Status: AC
  Administered 2016-08-09: 40 meq via ORAL
  Filled 2016-08-09: qty 2

## 2016-08-09 MED ORDER — METHOCARBAMOL 500 MG PO TABS: 1000.0000 mg | ORAL_TABLET | Freq: Three times a day (TID) | ORAL | Status: DC | PRN

## 2016-08-09 MED ORDER — QUETIAPINE FUMARATE 100 MG PO TABS: 200.0000 mg | ORAL_TABLET | Freq: Every day | ORAL | Status: DC

## 2016-08-09 MED ORDER — ALUM & MAG HYDROXIDE-SIMETH 200-200-20 MG/5ML PO SUSP: 30.0000 mL | ORAL | Status: DC | PRN

## 2016-08-09 NOTE — ED Notes (Signed)
Pt stated "I take my adderall @ 0800 and noon"

## 2016-08-09 NOTE — ED Notes (Addendum)
When pt asked if he had thoughts to hurt or harm himself, he denied. He stated, " I didn't  want to kill myself, I just wish that they had let me die." Deputy at bedside. Pt is calm and cooperative, watching television.   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 He is up to date on his Tetanus.

## 2016-08-09 NOTE — ED Notes (Signed)
By pt report, works on cell towers.

## 2016-08-09 NOTE — ED Provider Notes (Signed)
[facility]-EMERGENCY DEPT Provider Note   CSN: [id number] Arrival date & time: 08/09/16  1736     History   Chief Complaint Chief Complaint  Patient presents with  . Medical Clearance    HPI Jacob Newton is a 30 y.o. male.  He presents for evaluation of thoughts of suicide, associated with heroin overdose multiple times. He sent a text message to his 58 year old daughter stating that he " wished that they let me die". He currently lives with his ex-wife. Their children have been removed from the home because of protective services concerns regarding his substance abuse. Earlier this week. His wife treated him with Narcan when she found him unconscious. She did that again today. Prior to the overdose today, he told his wife that he did not want her to leave the house, because he would be lonely.  He denies abuse of alcohol or other illegal drugs. He denies taking anything else to hurt himself. There are no other known modifying factors.  HPI  Past Medical History:  Diagnosis Date  . ADHD (attention deficit hyperactivity disorder)   . Back pain    chronic low back pain, with L%-S1 stenosis/bulge sisc/arthropathy  . Closed lumbar fx w/ cord inj 2010   hx lumbar fx  . Opiate abuse, continuous   . Reactive airways dysfunction syndrome    with illnesses  . Viral meningitis 7/15    Patient Active Problem List   Diagnosis Date Noted  . Migraine syndrome 06/20/2016  . 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 Adjustment reaction 01/14/2016  . Screening for lipoid disorders 05/21/2015  . Adverse effects of medication 09/05/2013  . ADD (attention deficit disorder) 06/28/2012  . Poor concentration 02/23/2012  . Routine general medical examination at a health care facility 02/12/2012  . Smoker 08/22/2011  . Chronic back pain 02/07/2010    History reviewed. No pertinent surgical history.     Home Medications    Prior to Admission medications   Medication Sig Start Date End  Date Taking? Authorizing Provider  amphetamine-dextroamphetamine (ADDERALL) 20 MG tablet Take 20 mg by mouth 2 (two) times daily.    Historical Provider, MD  Aspirin-Salicylamide-Caffeine (BC HEADACHE) 325-95-16 MG TABS Take 1 each by mouth 2 (two) times daily as needed (for headache).     Historical Provider, MD  gabapentin (NEURONTIN) 400 MG capsule Take 400 mg by mouth 4 (four) times daily.    Historical Provider, MD  methocarbamol (ROBAXIN) 500 MG tablet Take 2 tablets (1,000 mg total) by mouth every 4 (four) hours as needed. Patient taking differently: Take 1,000 mg by mouth every 4 (four) hours as needed for muscle spasms.  06/20/16   Judy Pimple, MD  QUEtiapine (SEROQUEL) 200 MG tablet Take 200 mg by mouth at bedtime.  05/15/16   Historical Provider, MD  SUMAtriptan (IMITREX) 100 MG tablet Take 1 tablet (100 mg total) by mouth every 2 (two) hours as needed for migraine. May repeat in 2 hours if headache persists or recurs. 06/20/16   Judy Pimple, MD    Family History Family History  Problem Relation Age of Onset  . Hypertension Mother   . Diabetes Mother     Social History Social History  Substance Use Topics  . Smoking status: Current Every Day Smoker    Packs/day: 1.00    Types: E-cigarettes  . Smokeless tobacco: Never Used     Comment: vape   . Alcohol use No     Comment: rarely on  holidays     Allergies   Chantix [varenicline tartrate]   Review of Systems Review of Systems  All other systems reviewed and are negative.    Physical Exam Updated Vital Signs There were no vitals taken for this visit.  Physical Exam  Constitutional: He is oriented to person, place, and time. He appears well-developed and well-nourished.  HENT:  Head: Normocephalic and atraumatic.  Right Ear: External ear normal.  Left Ear: External ear normal.  Eyes: Conjunctivae and EOM are normal. Pupils are equal, round, and reactive to light.  Neck: Normal range of motion and phonation  normal. Neck supple.  Cardiovascular: Normal rate, regular rhythm and normal heart sounds.   Pulmonary/Chest: Effort normal and breath sounds normal. He exhibits no bony tenderness.  Abdominal: Soft. There is no tenderness.  Musculoskeletal: Normal range of motion.  Neurological: He is alert and oriented to person, place, and time. No cranial nerve deficit or sensory deficit. He exhibits normal muscle tone. Coordination normal.  Skin: Skin is warm, dry and intact.  Psychiatric: He has a normal mood and affect. His behavior is normal. Judgment and thought content normal.  Nursing note and vitals reviewed.    ED Treatments / Results  Labs (all labs ordered are listed, but only abnormal results are displayed) Labs Reviewed  COMPREHENSIVE METABOLIC PANEL - Abnormal; Notable for the following:       Result Value   Potassium 3.4 (*)    Chloride 100 (*)    All other components within normal limits  ETHANOL  CBC  RAPID URINE DRUG SCREEN, HOSP PERFORMED    EKG  EKG Interpretation None       Radiology No results found.  Procedures Procedures (including critical care time)  Medications Ordered in ED Medications  acetaminophen (TYLENOL) tablet 650 mg (not administered)  ibuprofen (ADVIL,MOTRIN) tablet 600 mg (not administered)  zolpidem (AMBIEN) tablet 5 mg (not administered)  nicotine (NICODERM CQ - dosed in mg/24 hours) patch 21 mg (not administered)  ondansetron (ZOFRAN) tablet 4 mg (not administered)  alum & mag hydroxide-simeth (MAALOX/MYLANTA) 200-200-20 MG/5ML suspension 30 mL (not administered)  amphetamine-dextroamphetamine (ADDERALL) tablet 20 mg (not administered)  gabapentin (NEURONTIN) capsule 400 mg (not administered)  methocarbamol (ROBAXIN) tablet 1,000 mg (not administered)  QUEtiapine (SEROQUEL) tablet 200 mg (not administered)     Initial Impression / Assessment and Plan / ED Course  I have reviewed the triage vital signs and the nursing  notes.  Pertinent labs & imaging results that were available during my care of the patient were reviewed by me and considered in my medical decision making (see chart for details).  Clinical Course as of Aug 09 2018  Sat Aug 09, 2016  2017 At this point, patient is medically cleared for treatment by psychiatry.  [EW]    Clinical Course User Index [EW] Mancel BaleElliott Recie Cirrincione, MD    Medications  acetaminophen (TYLENOL) tablet 650 mg (not administered)  ibuprofen (ADVIL,MOTRIN) tablet 600 mg (not administered)  zolpidem (AMBIEN) tablet 5 mg (not administered)  nicotine (NICODERM CQ - dosed in mg/24 hours) patch 21 mg (not administered)  ondansetron (ZOFRAN) tablet 4 mg (not administered)  alum & mag hydroxide-simeth (MAALOX/MYLANTA) 200-200-20 MG/5ML suspension 30 mL (not administered)  amphetamine-dextroamphetamine (ADDERALL) tablet 20 mg (not administered)  gabapentin (NEURONTIN) capsule 400 mg (not administered)  methocarbamol (ROBAXIN) tablet 1,000 mg (not administered)  QUEtiapine (SEROQUEL) tablet 200 mg (not administered)    No data found.   TTS consult   Final Clinical Impressions(s) /  ED Diagnoses   Final diagnoses:  Suicidal ideation  Heroin abuse    Abuse with multiple overdoses, this week, with depression, and situational distress. He'll require evaluation, treatment by psychiatry. He is currently under involuntary commitment.   Nursing Notes Reviewed/ Care Coordinated, and agree without changes. Applicable Imaging Reviewed.  Interpretation of Laboratory Data incorporated into ED treatment  Plan- as per TTS in conjunction with oncoming provider team  New Prescriptions New Prescriptions   No medications on file     Mancel BaleElliott Rob Mciver, MD 08/09/16 2019

## 2016-08-09 NOTE — ED Notes (Signed)
Pt stated "I just got back from [place] working.  I didn't take use heroin when I was there but when I get back here, my ex-wife is what causes me to use it.  It's the fentanyl that's going to kill me.  I broke my back years ago and they told me they couldn't fix it.  The heroin was cheaper than the pain pills.  I also got fired when I got back, my boss won't tell me why, just need to call HR.  He even took my truck away from me.  I'm waiting for an interview with another company.  I was one month short of graduating.  I have 4 kids.  2 younger ones by my ex.  The oldest can't even look at me.  They've seen me dead twice."

## 2016-08-09 NOTE — BH Assessment (Signed)
Tele Assessment Note   Jacob RuddyJohnathan S Newton is an 30 y.o. separated male who presents to [facility] ED after his wife, Jacob Newton [phone], petitioned for involuntary commitment. Affidavit and petition states: "Resp diagnosis ADHD "Lower back pain"; Doctor took resp off opioids; current meds (Adderall, Gabapentin, Roboxan, Suboxone) overdosed on heroin Monday (11/27), told wife and 30 yr old daughter that he wished they had left him die; Overdosed again today (12/02); suicidal, dangerous to himself, substance abuser."  Pt reports he started using opioid medications in 2009 after a back injury and was addicted one yea later. He reports he has used heroin, both snorting and intravenously, for one year. Pt reports he was diagnosed with depression earlier this year and is on psychiatric medications. He has overdosed on heroin twice within the past six days and required Narcan. Pt told his wife and fourteen year old daughter that he wished they had let him die. Pt's wife reports Pt has seriously overdosed at least eight times. Pt reports passive suicidal ideation with no plan or intent. He acknowledges feeling sad and depressed with crying spells, staying in bed and feeling hopeless. Pt denies homicidal ideation or history of violence. Pt denies any psychotic symptoms.  Pt is unable to estimate how much heroin he used tonight or how much he generally uses. Pt's wife reports Pt's heroin is laced with fentanyl and Pt has a difficult time estimating how much heroin he needs to get high. Pt reports a history of abusing various pain medications but denies any recent use. Pt denies alcohol or other substance use. Pt's urine drug screen is pending.  Pt reports several stressors. His wife separated from him one month ago. Child Protective Services has an active investigation due to Pt's substance use and his children are not allowed in the home with Pt. Pt has five children, ages fourteen, twelve, eleven, seven  and five. Pt recently was fired from his job working on [employer]. Pt's wife said Pt doesn't want her to leave the house because he is lonely "and doesn't want to be alone unless he is high." Pt reports his father had a substance abuse problem and his mother has been treated for depression and anxiety. Pt denies current legal problems other than a traffic violation.   Pt reports one previous inpatient treatment for depression and substance abuse at [facility]. He reports he was there thirty day and discharged 05/15/16. Pt's wife reports he relapsed four days after discharge. Pt's wife says Pt was "kicked out" of outpatient treatment at [facility] due to failing two drug tests. Pt says he has no current outpatient providers.   Pt is dressed in hospital scrubs, alert, oriented x4 with normal speech and normal motor behavior. Eye contact is good. Pt's mood is depressed and sad; affect is congruent with mood. Thought process is coherent and relevant. There is no indication Pt is currently responding to internal stimuli or experiencing delusional thought content. Pt says he does not want to be psychiatrically hospitalized.    Diagnosis: Opioid Use Disorder, Severe; Major Depressive Disorder, Recurrent, Moderate  Past Medical History:  Past Medical History:  Diagnosis Date  . ADHD (attention deficit hyperactivity disorder)   . Back pain    chronic low back pain, with L%-S1 stenosis/bulge sisc/arthropathy  . Closed lumbar fx w/ cord inj 2010   hx lumbar fx  . Opiate abuse, continuous   . Reactive airways dysfunction syndrome    with illnesses  .  Viral meningitis 7/15    History reviewed. No pertinent surgical history.  Family History:  Family History  Problem Relation Age of Onset  . Hypertension Mother   . Diabetes Mother     Social History:  reports that he has been smoking E-cigarettes.  He has been smoking about 1.00 pack per day. He has never used smokeless tobacco. He  reports that he uses drugs, including IV. He reports that he does not drink alcohol.  Additional Social History:  Alcohol / Drug Use Pain Medications: Pt has history of abusing pain medications Prescriptions: Denies abuse Over the Counter: Denies abuse History of alcohol / drug use?: Yes Longest period of sobriety (when/how long): Six weeks Negative Consequences of Use: Financial, Personal relationships, Work / School Substance #1 Name of Substance 1: Heroin 1 - Age of First Use: 29 1 - Amount (size/oz): Varies, Pt cannot estimate 1 - Frequency: Daily when available 1 - Duration: One year 1 - Last Use / Amount: 08/09/16, unknown amount  CIWA:   COWS:    PATIENT STRENGTHS: (choose at least two) Ability for insight Average or above average intelligence Capable of independent living Communication skills General fund of knowledge Physical Health Supportive family/friends Work skills  Allergies:  Allergies  Allergen Reactions  . Chantix [Varenicline Tartrate] Other (See Comments)    Bad dreams    Home Medications:  (Not in a hospital admission)  OB/GYN Status:  No LMP for male patient.  General Assessment Data Location of Assessment: [facility] ED TTS Assessment: In system Is this a Tele or Face-to-Face Assessment?: Face-to-Face Is this an Initial Assessment or a Re-assessment for this encounter?: Initial Assessment Marital status: Separated Maiden name: NA Is patient pregnant?: No Pregnancy Status: No Living Arrangements: Spouse/significant other Can pt return to current living arrangement?: No Admission Status: Involuntary Is patient capable of signing voluntary admission?: Yes Referral Source: Self/Family/Friend Insurance type: Community education officer[employer]etna     Crisis Care Plan Living Arrangements: Spouse/significant other Legal Guardian: Other: (Self) Name of Psychiatrist: None Name of Therapist: None  Education Status Is patient currently in school?: No Current Grade: NA Highest  grade of school patient has completed: 5111 Name of school: NA Contact person: NA  Risk to self with the past 6 months Suicidal Ideation: Yes-Currently Present Has patient been a risk to self within the past 6 months prior to admission? : Yes Suicidal Intent: No Has patient had any suicidal intent within the past 6 months prior to admission? : No Is patient at risk for suicide?: Yes Suicidal Plan?: No Has patient had any suicidal plan within the past 6 months prior to admission? : No Access to Means: No What has been your use of drugs/alcohol within the last 12 months?: Pt is using heroin Previous Attempts/Gestures: No How many times?: 0 Other Self Harm Risks: Pt is repeatedly overdosing on heroin Triggers for Past Attempts: None known Intentional Self Injurious Behavior: None Family Suicide History: No Recent stressful life event(s): Conflict (Comment), Divorce, Job Loss, Financial Problems Persecutory voices/beliefs?: No Depression: Yes Depression Symptoms: Despondent, Tearfulness, Fatigue, Guilt, Feeling worthless/self pity Substance abuse history and/or treatment for substance abuse?: Yes Suicide prevention information given to non-admitted patients: Not applicable  Risk to Others within the past 6 months Homicidal Ideation: No Does patient have any lifetime risk of violence toward others beyond the six months prior to admission? : No Thoughts of Harm to Others: No Current Homicidal Intent: No Current Homicidal Plan: No Access to Homicidal Means: No Identified Victim:  None History of harm to others?: No Assessment of Violence: None Noted Violent Behavior Description: Pt denies history of violence Does patient have access to weapons?: No Criminal Charges Pending?: No Does patient have a court date: No Is patient on probation?: No  Psychosis Hallucinations: None noted Delusions: None noted  Mental Status Report Appearance/Hygiene: In scrubs Eye Contact: Good Motor  Activity: Unremarkable Speech: Logical/coherent Level of Consciousness: Alert Mood: Depressed, Sad, Guilty Affect: Depressed Anxiety Level: None Thought Processes: Coherent, Relevant Judgement: Partial Orientation: Person, Place, Time, Situation, Appropriate for developmental age Obsessive Compulsive Thoughts/Behaviors: None  Cognitive Functioning Concentration: Normal Memory: Recent Intact, Remote Intact IQ: Average Insight: Fair Impulse Control: Fair Appetite: Good Weight Loss: 0 Weight Gain: 0 Sleep: No Change Total Hours of Sleep: 8 Vegetative Symptoms: Staying in bed  ADLScreening [facility] Assessment Services) Patient's cognitive ability adequate to safely complete daily activities?: Yes Patient able to express need for assistance with ADLs?: Yes Independently performs ADLs?: Yes (appropriate for developmental age)  Prior Inpatient Therapy Prior Inpatient Therapy: Yes Prior Therapy Dates: 04/2016 [facility] Reason for Treatment: Heroin  Prior Outpatient Therapy Prior Outpatient Therapy: Yes Prior Therapy Dates: 2017 Prior Therapy Facilty/Provider(s): [facility] Reason for Treatment: Depression, substance abuse Does patient have an ACCT team?: No Does patient have Intensive In-House Services?  : No Does patient have [facility] services? : No Does patient have P4CC services?: No  ADL Screening (condition at time of admission) Patient's cognitive ability adequate to safely complete daily activities?: Yes Is the patient deaf or have difficulty hearing?: No Does the patient have difficulty seeing, even when wearing glasses/contacts?: No Does the patient have difficulty concentrating, remembering, or making decisions?: No Patient able to express need for assistance with ADLs?: Yes Does the patient have difficulty dressing or bathing?: No Independently performs ADLs?: Yes (appropriate for developmental age) Does the patient have  difficulty walking or climbing stairs?: No Weakness of Legs: None Weakness of Arms/Hands: None  Home Assistive Devices/Equipment Home Assistive Devices/Equipment: None    Abuse/Neglect Assessment (Assessment to be complete while patient is alone) Physical Abuse: Denies Verbal Abuse: Denies Sexual Abuse: Denies Exploitation of patient/patient's resources: Denies Self-Neglect: Denies     Merchant navy officer (For Healthcare) Does Patient Have a Medical Advance Directive?: No Would patient like information on creating a medical advance directive?: No - Patient declined    Additional Information 1:1 In Past 12 Months?: No CIRT Risk: No Elopement Risk: No Does patient have medical clearance?: Yes     Disposition: Binnie Rail, AC at [facility], confirms bed availability. Gave clinical report to Nira Conn, NP who said Pt meets criteria for inpatient dual-diagnosis treatment and accepted Pt to the service of Dr. Mckinley Jewel, room 302-1. Binnie Rail requests urine be collected and potassium be given before Pt is transported. Notified Dr. Mancel Bale and TCU staff of acceptance and requests of [facility].  Disposition Initial Assessment Completed for this Encounter: Yes Disposition of Patient: Inpatient treatment program Type of inpatient treatment program: Adult   Pamalee Leyden, [facility], [facility], [facility] Triage Specialist [phone]   Pamalee Leyden 08/09/2016 8:33 PM

## 2016-08-09 NOTE — ED Triage Notes (Addendum)
Pt arrived with Deputy under IVC. Informed by deputy that he has had to go to pt home 3 times this week for OD. Pt had an overdose today and was narcaned. He mentioned that he wished they had let him die and not revived him.  Pt states he does not want to hurt or harm himself and denies SI.

## 2016-08-09 NOTE — ED Notes (Signed)
Pt ambulated from triage to room 31. Pt triaged and assessed in room. Deputy at bedside.

## 2016-08-09 NOTE — ED Notes (Signed)
Pt has been provided with multiple sodas & still states "I can't piss".

## 2016-08-09 NOTE — ED Notes (Signed)
Pt remains in [facility]. 

## 2016-08-09 NOTE — ED Notes (Signed)
Pt stated "I've tried to give a urine sample but I'm not able to."

## 2016-08-10 ENCOUNTER — Inpatient Hospital Stay (HOSPITAL_COMMUNITY)
Admission: AD | Admit: 2016-08-10 | Discharge: 2016-08-12 | DRG: 885 | Disposition: A | Discharge status: Home / Self Care | Payer: Managed Care, Other (non HMO) | Source: Intra-hospital | Attending: Psychiatry | Admitting: Psychiatry

## 2016-08-10 ENCOUNTER — Encounter (HOSPITAL_COMMUNITY): Payer: Self-pay

## 2016-08-10 DIAGNOSIS — Y92009 Unspecified place in unspecified non-institutional (private) residence as the place of occurrence of the external cause: Secondary | ICD-10-CM

## 2016-08-10 DIAGNOSIS — F17210 Nicotine dependence, cigarettes, uncomplicated: Secondary | ICD-10-CM | POA: Diagnosis present

## 2016-08-10 DIAGNOSIS — F1110 Opioid abuse, uncomplicated: Secondary | ICD-10-CM | POA: Diagnosis not present

## 2016-08-10 DIAGNOSIS — Z8249 Family history of ischemic heart disease and other diseases of the circulatory system: Secondary | ICD-10-CM

## 2016-08-10 DIAGNOSIS — F1994 Other psychoactive substance use, unspecified with psychoactive substance-induced mood disorder: Secondary | ICD-10-CM | POA: Diagnosis not present

## 2016-08-10 DIAGNOSIS — F909 Attention-deficit hyperactivity disorder, unspecified type: Secondary | ICD-10-CM | POA: Diagnosis present

## 2016-08-10 DIAGNOSIS — Z888 Allergy status to other drugs, medicaments and biological substances status: Secondary | ICD-10-CM

## 2016-08-10 DIAGNOSIS — Z833 Family history of diabetes mellitus: Secondary | ICD-10-CM

## 2016-08-10 DIAGNOSIS — F322 Major depressive disorder, single episode, severe without psychotic features: Secondary | ICD-10-CM | POA: Diagnosis not present

## 2016-08-10 DIAGNOSIS — Z79899 Other long term (current) drug therapy: Secondary | ICD-10-CM

## 2016-08-10 DIAGNOSIS — T401X1A Poisoning by heroin, accidental (unintentional), initial encounter: Secondary | ICD-10-CM | POA: Diagnosis present

## 2016-08-10 DIAGNOSIS — T40601A Poisoning by unspecified narcotics, accidental (unintentional), initial encounter: Secondary | ICD-10-CM

## 2016-08-10 MED ORDER — DICYCLOMINE HCL 20 MG PO TABS: 20.0000 mg | ORAL_TABLET | Freq: Four times a day (QID) | ORAL | Status: DC | PRN

## 2016-08-10 MED ORDER — LOPERAMIDE HCL 2 MG PO CAPS
2.0000 mg | ORAL_CAPSULE | ORAL | Status: DC | PRN
Start: 2016-08-10 — End: 2016-08-12

## 2016-08-10 MED ORDER — ACETAMINOPHEN 325 MG PO TABS
650.0000 mg | ORAL_TABLET | Freq: Four times a day (QID) | ORAL | Status: DC | PRN
  Administered 2016-08-10 – 2016-08-11 (×3): 650 mg via ORAL
  Filled 2016-08-10 (×3): qty 2

## 2016-08-10 MED ORDER — HYDROXYZINE HCL 25 MG PO TABS
25.0000 mg | ORAL_TABLET | Freq: Four times a day (QID) | ORAL | Status: DC | PRN
  Administered 2016-08-10: 25 mg via ORAL
  Filled 2016-08-10: qty 1

## 2016-08-10 MED ORDER — NICOTINE 21 MG/24HR TD PT24
21.0000 mg | MEDICATED_PATCH | Freq: Every day | TRANSDERMAL | Status: DC
  Administered 2016-08-10 – 2016-08-12 (×3): 21 mg via TRANSDERMAL
  Filled 2016-08-10 (×5): qty 1

## 2016-08-10 MED ORDER — METHOCARBAMOL 500 MG PO TABS
500.0000 mg | ORAL_TABLET | Freq: Three times a day (TID) | ORAL | Status: DC | PRN
  Administered 2016-08-10 – 2016-08-11 (×2): 500 mg via ORAL
  Filled 2016-08-10 (×2): qty 1

## 2016-08-10 MED ORDER — QUETIAPINE FUMARATE 200 MG PO TABS
200.0000 mg | ORAL_TABLET | Freq: Every day | ORAL | Status: DC
  Administered 2016-08-10 – 2016-08-11 (×2): 200 mg via ORAL
  Filled 2016-08-10 (×3): qty 1

## 2016-08-10 MED ORDER — ALUM & MAG HYDROXIDE-SIMETH 200-200-20 MG/5ML PO SUSP: 30.0000 mL | ORAL | Status: DC | PRN

## 2016-08-10 MED ORDER — CLONIDINE HCL 0.1 MG PO TABS
0.1000 mg | ORAL_TABLET | Freq: Four times a day (QID) | ORAL | Status: DC
  Administered 2016-08-10 – 2016-08-11 (×5): 0.1 mg via ORAL
  Filled 2016-08-10 (×8): qty 1

## 2016-08-10 MED ORDER — HYDROXYZINE HCL 25 MG PO TABS
25.0000 mg | ORAL_TABLET | Freq: Three times a day (TID) | ORAL | Status: DC | PRN
  Administered 2016-08-10: 25 mg via ORAL
  Filled 2016-08-10: qty 1

## 2016-08-10 MED ORDER — CLONIDINE HCL 0.1 MG PO TABS: 0.1000 mg | ORAL_TABLET | Freq: Every day | ORAL | Status: DC

## 2016-08-10 MED ORDER — ONDANSETRON 4 MG PO TBDP: 4.0000 mg | ORAL_TABLET | Freq: Four times a day (QID) | ORAL | Status: DC | PRN

## 2016-08-10 MED ORDER — NAPROXEN 500 MG PO TABS: 500.0000 mg | ORAL_TABLET | Freq: Two times a day (BID) | ORAL | Status: DC | PRN

## 2016-08-10 MED ORDER — METHOCARBAMOL 500 MG PO TABS
1000.0000 mg | ORAL_TABLET | ORAL | Status: DC | PRN
  Administered 2016-08-10 (×2): 1000 mg via ORAL
  Filled 2016-08-10 (×2): qty 2

## 2016-08-10 MED ORDER — MAGNESIUM HYDROXIDE 400 MG/5ML PO SUSP: 30.0000 mL | Freq: Every day | ORAL | Status: DC | PRN

## 2016-08-10 MED ORDER — GABAPENTIN 400 MG PO CAPS
400.0000 mg | ORAL_CAPSULE | Freq: Three times a day (TID) | ORAL | Status: DC
  Administered 2016-08-10 – 2016-08-12 (×7): 400 mg via ORAL
  Filled 2016-08-10 (×10): qty 1

## 2016-08-10 MED ORDER — CLONIDINE HCL 0.1 MG PO TABS: 0.1000 mg | ORAL_TABLET | ORAL | Status: DC

## 2016-08-10 NOTE — [facility] Group Notes (Signed)
[facility] Group Notes:  (Nursing/MHT/Case Management/Adjunct)  Date:  08/10/2016  Time:  4:00 PM  Type of Therapy:  Nurse Education  Participation Level:  Minimal  Participation Quality:  Resistant  Affect:  Flat and Irritable  Cognitive:  Appropriate  Insight:  Improving  Engagement in Group:  Poor  Modes of Intervention:  Education  Summary of Progress/Problems: Discussed positive communication skills.  Almira Barenny G Jahad Old 08/10/2016, 4:00 PM

## 2016-08-10 NOTE — [facility] Suicide Risk Assessment (Signed)
[facility]   Nursing information obtained from:    Demographic factors:    Current Mental Status:    Loss Factors:    Historical Factors:    Risk Reduction Factors:     Total Time spent with patient: 1 hour Principal Problem: <principal problem not specified> Diagnosis:   Patient Active Problem List   Diagnosis Date Noted  . Major depressive disorder, severe (HCC) [F32.2] 08/10/2016  . Migraine syndrome [G43.909] 06/20/2016  . Addiction to drug (HCC) [F19.20] 01/14/2016  . Adjustment reaction [F43.20] 01/14/2016  . Screening for lipoid disorders [Z13.220] 05/21/2015  . Adverse effects of medication [T88.7XXA] 09/05/2013  . ADD (attention deficit disorder) [F98.8] 06/28/2012  . Poor concentration [R41.840] 02/23/2012  . Routine general medical examination at a health care facility [Z00.00] 02/12/2012  . Smoker [F17.200] 08/22/2011  . Chronic back pain [M54.9, G89.29] 02/07/2010   Subjective Data: Alert oriented, somewhat vague about his drug and amount use. Endorses depression  Continued Clinical Symptoms:  Alcohol Use Disorder Identification Test Final Score (AUDIT): 0 The "Alcohol Use Disorders Identification Test", Guidelines for Use in Primary Care, Second Edition.  World Science writerHealth Organization [facility](WHO). Score between 0-7:  no or low risk or alcohol related problems. Score between 8-15:  moderate risk of alcohol related problems. Score between 16-19:  high risk of alcohol related problems. Score 20 or above:  warrants further diagnostic evaluation for alcohol dependence and treatment.   CLINICAL FACTORS:   Depression:   Anhedonia Hopelessness Impulsivity Alcohol/Substance Abuse/Dependencies More than one psychiatric diagnosis Unstable or Poor Therapeutic Relationship Previous Psychiatric Diagnoses and Treatments   Musculoskeletal: Strength & Muscle Tone: within normal limits Gait & Station: normal Patient leans: no lean  Psychiatric  Specialty Exam: Physical Exam  ROS  Blood pressure (!) 151/98, pulse 91, temperature 98.4 F (36.9 C), temperature source Oral, resp. rate 18, height 5\' 6"  (1.676 m), weight 71.7 kg (158 lb), SpO2 100 %.Body mass index is 25.5 kg/m.  General Appearance: Casual  Eye Contact:  Fair  Speech:  Normal Rate  Volume:  Normal  Mood:  Dysphoric  Affect:  Congruent and Constricted  Thought Process:  Goal Directed  Orientation:  Full (Time, Place, and Person)  Thought Content:  Rumination  Suicidal Thoughts:  No  Homicidal Thoughts:  No  Memory:  Immediate;   Fair Recent;   Fair  Judgement:  Poor  Insight:  Shallow  Psychomotor Activity:  Normal  Concentration:  Concentration: Fair and Attention Span: Fair  Recall:  [employer]  Fund of Knowledge:  Fair  Language:  Fair  Akathisia:  Negative  Handed:  Right  AIMS (if indicated):     Assets:  Desire for Improvement Social Support  ADL's:  Intact  Cognition:  WNL  Sleep:  Number of Hours: 2      COGNITIVE FEATURES THAT CONTRIBUTE TO RISK:  Closed-mindedness    SUICIDE RISK:   Moderate:  Frequent suicidal ideation with limited intensity, and duration, some specificity in terms of plans, no associated intent, good self-control, limited dysphoria/symptomatology, some risk factors present, and identifiable protective factors, including available and accessible social support.   PLAN OF CARE: Admit for stabilization. Medication management. Co morbid substance use and detox  I certify that inpatient services furnished can reasonably be expected to improve the patient's condition.  Thresa RossAKHTAR, Jaymz Traywick, MD 08/10/2016, 10:54 AM

## 2016-08-10 NOTE — Progress Notes (Signed)
Patient ID: Jacob RuddyJohnathan S Newton, male   DOB: 10/31/1985, 30 y.o.   MRN: [id number]  30 [facility] to "my ex thinking that I'm overdosing intentionally." Pt IVCe'd by wife. Pt reports overdosing on heroin twice recently. Pt also endorses cocaine use. 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 Pt reports a past medical or mental health treatment of chronic back pain from a vertebral injury he sustained after a fall and ADHD. Per chart review, patient also has a history of reactive airway dysfunction syndrome and viral meningitis.  Pt currently denies SI/HI and A/V hallucinations. Pt verbally agrees to seek staff if SI/HI or A/VH occurs and to consult with staff before acting on any harmful thoughts. Consents signed, skin/belongings search completed and pt oriented to unit. Pt stable at this time. Pt given the opportunity to express concerns and ask questions. Pt given toiletries and meal. Will continue to monitor.

## 2016-08-10 NOTE — Progress Notes (Signed)
Data. Patient denies SI/HI/AVH. Patient interacting well with staff and other patients. Patient requested money from his locker. Nurse and security took 1x $10 note and 2 x $1 and nurse gave to patient. Patient, nurse and [facility] signed his belongings sheet reflecting this. Affect has been anxious and closed. On his self assessment he reports 0/10 for anxiety, depression and hopelessness. His goal for today is: "Me". Patient has stated multiple times, "I don't need to be here. My sister and wife put me here to get back at me". Patient shows no insight into his behavior.  Action. Emotional support and encouragement offered. Education provided on medication, indications and side effect. Q 15 minute checks done for safety. Response. Safety on the unit maintained through 15 minute checks.  Medications taken as prescribed. Attended groups. Remained calm and appropriate through out shift.

## 2016-08-10 NOTE — Tx Team (Signed)
Initial Treatment Plan 08/10/2016 3:44 AM Wynona CanesJohnathan S Reger MVH:[id number]    PATIENT STRESSORS: Financial difficulties Marital or family conflict Occupational concerns Substance abuse   PATIENT STRENGTHS: Ability for insight Active sense of humor Average or above average intelligence Communication skills   PATIENT IDENTIFIED PROBLEMS: Substance abuse- "heroin and cocaine"  Suicide Risk- "I've overdosed two times recently"  "My ex and I got separated two months ago"  "I just got back from [place] and started using again"  Recently lost his job             DISCHARGE CRITERIA:  Improved stabilization in mood, thinking, and/or behavior Reduction of life-threatening or endangering symptoms to within safe limits Safe-care adequate arrangements made Withdrawal symptoms are absent or subacute and managed without 24-hour nursing intervention  PRELIMINARY DISCHARGE PLAN: Attend aftercare/continuing care group Attend 12-step recovery group Participate in family therapy Placement in alternative living arrangements  PATIENT/FAMILY INVOLVEMENT: This treatment plan has been presented to and reviewed with the patient, Jacob Newton .The patient and family have been given the opportunity to ask questions and make suggestions.  Aurora Maskwyman, Floetta Brickey E, RN 08/10/2016, 3:44 AM

## 2016-08-10 NOTE — [facility] Group Notes (Signed)
[facility] Group Notes:  (Clinical Social Work)  08/10/2016  10:00-11:00AM  Summary of Progress/Problems:   The main focus of today's process group was to   1)  discuss the importance of adding supports  2)  define health supports versus unhealthy supports  3)  identify the patient's current healthy unhealthy supports and   4)  Introduce tapping.  An emphasis was placed on using counselor, doctor, therapy groups, 12-step groups, and problem-specific support groups to expand supports.    The patient expressed full comprehension of the concepts presented, and agreed that there is a need to add more supports.  The patient stated his ex-wife is an unhealthy support "because she's there" and a healthy support for him is his car hobby.  He did not speak otherwise, appeared irritable when called on at any time.  Type of Therapy:  Process Group  Participation Level:  Minimal  Participation Quality:  Attentive  Affect:  Blunted and Irritable  Cognitive:  Alert  Insight:  Limited  Engagement in Therapy:  Limited  Modes of Intervention:   Education, Support and Processing, Activity  Ambrose MantleMareida Grossman-Orr, LCSW 08/10/2016

## 2016-08-10 NOTE — Progress Notes (Signed)
Nursing Progress Note: 7p-7a D: Pt currently presents with a arrogant affect and behavior. Pt reports to writer that their goal is to "go home." Pt states "I don't need to be here. I'm tired of this." Pt reports "fine" sleep with current medication regimen.   A: Pt provided with medications per providers orders. Pt's labs and vitals were monitored throughout the night. Pt supported emotionally and encouraged to express concerns and questions. Pt educated on medications.  R: Pt's safety ensured with 15 minute and environmental checks. Pt currently denies SI/HI/Self Harm and A/V hallucinations. Pt verbally agrees to seek staff if SI/HI or A/VH occurs and to consult with staff before acting on any harmful thoughts. Will continue POC.

## 2016-08-10 NOTE — Progress Notes (Signed)
Patient did attend the evening speaker AA meeting.  

## 2016-08-10 NOTE — H&P (Addendum)
Psychiatric Admission Assessment Adult  Patient Identification: Jacob Newton MRN:  [id number] Date of Evaluation:  08/10/2016 Chief Complaint:  mdd opioid use disorder Principal Diagnosis: <principal problem not specified> Diagnosis:   Patient Active Problem List   Diagnosis Date Noted  . Major depressive disorder, severe ([place]) [F32.2] 08/10/2016  . Migraine syndrome [G43.909] 06/20/2016  . Addiction to drug ([place]) [F19.20] 01/14/2016  . Adjustment reaction [F43.20] 01/14/2016  . Screening for lipoid disorders [Z13.220] 05/21/2015  . Adverse effects of medication [T88.7XXA] 09/05/2013  . ADD (attention deficit disorder) [F98.8] 06/28/2012  . Poor concentration [R41.840] 02/23/2012  . Routine general medical examination at a health care facility [Z00.00] 02/12/2012  . Smoker [F17.200] 08/22/2011  . Chronic back pain [M54.9, G89.29] 02/07/2010   History of Present Illness: Initial presentation is as follows as per reported note.  "Jacob Newton is an 30 y.o. separated male who presents to [facility] after his wife, Jacob Newton [phone], petitioned for involuntary commitment. Affidavit and petition states: "Resp diagnosis ADHD "Lower back pain"; Doctor took resp off opioids; current meds (Adderall, Gabapentin, Roboxan, Suboxone) overdosed on heroin Monday (11/27), told wife and 5 yr old daughter that he wished they had left him die; Overdosed again today (12/02); suicidal, dangerous to himself, substance abuser."  Pt reports he started using opioid medications in 2009 after a back injury and was addicted one yea later. He reports he has used heroin, both snorting and intravenously, for one year. Pt reports he was diagnosed with depression earlier this year and is on psychiatric medications. He has overdosed on heroin twice within the past six days and required Narcan. Pt told his wife and 65 year old daughter that he wished they had let him die. Pt's wife reports  Pt has seriously overdosed at least eight times. Pt reports passive suicidal ideation with no plan or intent. He acknowledges feeling sad and depressed with crying spells, staying in bed and feeling hopeless. Pt denies homicidal ideation or history of violence. Pt denies any psychotic symptoms.  Pt is unable to estimate how much heroin he used tonight or how much he generally uses. Pt's wife reports Pt's heroin is laced with fentanyl and Pt has a difficult time estimating how much heroin he needs to get high. Pt reports a history of abusing various pain medications but denies any recent use. Pt denies alcohol or other substance use. Pt's urine drug screen is pending.  Pt reports several stressors. His wife separated from him one month ago. Child Protective Services has an active investigation due to Pt's substance use and his children are not allowed in the home with Pt. Pt has five children, ages 29, twelve, eleven, seven and five. Pt recently was fired from his job working on [employer]. Pt's wife said Pt doesn't want her to leave the house because he is lonely "and doesn't want to be alone unless he is high." Pt reports his father had a substance abuse problem and his mother has been treated for depression and anxiety. Pt denies current legal problems other than a traffic violation. " On evaluation today patient remains dysphoric but remains vague about his amount of use of heroine or if he laces. Wife according to notes has concern of his amount of use.  He has history of overdose but says it was accidental.  He has endorsed hopelessness, crying spells . Today does not endorse suicidal ideation but acknowledges sad and feeling despair No significant withdrawal symtpoms reported  Has been using various substances mostly pain meds in past  Does not endorse psychosis no delerium Aggravating factor: family stress, chronic use of drugs Modifying factor: some but limited Severity of  depression: 3/10. 10 being no depression  Associated Signs/Symptoms: Depression Symptoms:  anhedonia, difficulty concentrating, hopelessness, anxiety, loss of energy/fatigue, (Hypo) Manic Symptoms:  Distractibility, Impulsivity, Anxiety Symptoms:  Excessive Worry, Psychotic Symptoms:  denies  Total Time spent with patient: 1 hour  Past Psychiatric History: History of overdose on heroine, substance use disorder. Depression.  Is the patient at risk to self? Yes.    Has the patient been a risk to self in the past 6 months? Yes.    Has the patient been a risk to self within the distant past? Yes.    Is the patient a risk to others? No.  Has the patient been a risk to others in the past 6 months? No.  Has the patient been a risk to others within the distant past? No.    Alcohol Screening: 1. How often do you have a drink containing alcohol?: Never 2. How many drinks containing alcohol do you have on a typical day when you are drinking?: 1 or 2 3. How often do you have six or more drinks on one occasion?: Never Preliminary Score: 0 9. Have you or someone else been injured as a result of your drinking?: No 10. Has a relative or friend or a doctor or another health worker been concerned about your drinking or suggested you cut down?: No Alcohol Use Disorder Identification Test Final Score (AUDIT): 0 Brief Intervention: AUDIT score less than 7 or less-screening does not suggest unhealthy drinking-brief intervention not indicated Substance Abuse History in the last 12 months:  Yes.   Consequences of Substance Abuse: Medical Consequences:  depression, mood swings. Family Consequences:  conflicts Previous Psychotropic Medications: Yes  Psychological Evaluations: No  Past Medical History:  Past Medical History:  Diagnosis Date  . ADHD (attention deficit hyperactivity disorder)   . Back pain    chronic low back pain, with L%-S1 stenosis/bulge sisc/arthropathy  . Closed lumbar fx w/  cord inj 2010   hx lumbar fx  . Opiate abuse, continuous   . Reactive airways dysfunction syndrome    with illnesses  . Viral meningitis 7/15   History reviewed. No pertinent surgical history. Family History:  Family History  Problem Relation Age of Onset  . Hypertension Mother   . Diabetes Mother    Family Psychiatric  History: mom : depression/anxiety Tobacco Screening: Have you used any form of tobacco in the last 30 days? (Cigarettes, Smokeless Tobacco, Cigars, and/or Pipes): Yes Tobacco use, Select all that apply: 5 or more cigarettes per day (e cigarettes use) Are you interested in Tobacco Cessation Medications?: Yes, will notify MD for an order Counseled patient on smoking cessation including recognizing danger situations, developing coping skills and basic information about quitting provided: Refused/Declined practical counseling Social History:  History  Alcohol Use No    Comment: rarely on holidays     History  Drug Use  . Types: IV, Heroin, Cocaine    Comment: opiates, heroin    Additional Social History:      Pain Medications: Per chart review: pt has history of abusing pain medications Prescriptions: see PTA med list Over the Counter: denies History of alcohol / drug use?: Yes Name of Substance 1: Heroin 1 - Age of First Use: 29 1 - Amount (size/oz): Varies, Pt cannot estimate 1 - Frequency: "  twice this past week" 1 - Duration: One year 1 - Last Use / Amount: 08/09/16, unknown amount Name of Substance 2: cocaine 2 - Age of First Use: 19 2 - Amount (size/oz): "not much" 2 - Frequency: "when I can" 2 - Duration: past two weeks 2 - Last Use / Amount: 08/08/2016, unknown amount                Allergies:   Allergies  Allergen Reactions  . Chantix [Varenicline Tartrate] Other (See Comments)    Bad dreams   Lab Results:  Results for orders placed or performed during the hospital encounter of 08/09/16 (from the past 48 hour(s))  Comprehensive  metabolic panel     Status: Abnormal   Collection Time: 08/09/16  6:45 PM  Result Value Ref Range   Sodium 140 135 - 145 mmol/L   Potassium 3.4 (L) 3.5 - 5.1 mmol/L   Chloride 100 (L) 101 - 111 mmol/L   CO2 32 22 - 32 mmol/L   Glucose, Bld 89 65 - 99 mg/dL   BUN 11 6 - 20 mg/dL   Creatinine, Ser 0.95 0.61 - 1.24 mg/dL   Calcium 9.4 8.9 - 10.3 mg/dL   Total Protein 7.6 6.5 - 8.1 g/dL   Albumin 4.8 3.5 - 5.0 g/dL   AST 22 15 - 41 U/L   ALT 32 17 - 63 U/L   Alkaline Phosphatase 73 38 - 126 U/L   Total Bilirubin 1.2 0.3 - 1.2 mg/dL   GFR calc non Af Amer >60 >60 mL/min   GFR calc Af Amer >60 >60 mL/min    Comment: (NOTE) The eGFR has been calculated using the CKD EPI equation. This calculation has not been validated in all clinical situations. eGFR's persistently <60 mL/min signify possible Chronic Kidney Disease.    Anion gap 8 5 - 15  Ethanol     Status: None   Collection Time: 08/09/16  6:45 PM  Result Value Ref Range   Alcohol, Ethyl (B) <5 <5 mg/dL    Comment:        LOWEST DETECTABLE LIMIT FOR SERUM ALCOHOL IS 5 mg/dL FOR MEDICAL PURPOSES ONLY   cbc     Status: None   Collection Time: 08/09/16  6:45 PM  Result Value Ref Range   WBC 9.0 4.0 - 10.5 K/uL   RBC 5.49 4.22 - 5.81 MIL/uL   Hemoglobin 16.5 13.0 - 17.0 g/dL   HCT 47.9 39.0 - 52.0 %   MCV 87.2 78.0 - 100.0 fL   MCH 30.1 26.0 - 34.0 pg   MCHC 34.4 30.0 - 36.0 g/dL   RDW 12.7 11.5 - 15.5 %   Platelets 277 150 - 400 K/uL  Rapid urine drug screen (hospital performed)     Status: Abnormal   Collection Time: 08/09/16  9:51 PM  Result Value Ref Range   Opiates NONE DETECTED NONE DETECTED   Cocaine POSITIVE (A) NONE DETECTED   Benzodiazepines NONE DETECTED NONE DETECTED   Amphetamines POSITIVE (A) NONE DETECTED   Tetrahydrocannabinol NONE DETECTED NONE DETECTED   Barbiturates NONE DETECTED NONE DETECTED    Comment:        DRUG SCREEN FOR MEDICAL PURPOSES ONLY.  IF CONFIRMATION IS NEEDED FOR ANY PURPOSE,  NOTIFY LAB WITHIN 5 DAYS.        LOWEST DETECTABLE LIMITS FOR URINE DRUG SCREEN Drug Class       Cutoff (ng/mL) Amphetamine      1000 Barbiturate  200 Benzodiazepine   846 Tricyclics       659 Opiates          300 Cocaine          300 THC              50     Blood Alcohol level:  Lab Results  Component Value Date   ETH <5 08/09/2016   ETH <5 93/57/0177    Metabolic Disorder Labs:  No results found for: HGBA1C, MPG No results found for: PROLACTIN Lab Results  Component Value Date   CHOL 153 05/21/2015   TRIG 211.0 (H) 05/21/2015   HDL 36.40 (L) 05/21/2015   CHOLHDL 4 05/21/2015   VLDL 42.2 (H) 05/21/2015   LDLCALC 73 02/13/2012    Current Medications: Current Facility-Administered Medications  Medication Dose Route Frequency Provider Last Rate Last Dose  . acetaminophen (TYLENOL) tablet 650 mg  650 mg Oral Q6H PRN Rozetta Nunnery, NP      . alum & mag hydroxide-simeth (MAALOX/MYLANTA) 200-200-20 MG/5ML suspension 30 mL  30 mL Oral Q4H PRN Rozetta Nunnery, NP      . cloNIDine (CATAPRES) tablet 0.1 mg  0.1 mg Oral QID Derrill Center, NP       Followed by  . [START ON 08/12/2016] cloNIDine (CATAPRES) tablet 0.1 mg  0.1 mg Oral BH-qamhs Derrill Center, NP       Followed by  . [START ON 08/15/2016] cloNIDine (CATAPRES) tablet 0.1 mg  0.1 mg Oral QAC breakfast Derrill Center, NP      . dicyclomine (BENTYL) tablet 20 mg  20 mg Oral Q6H PRN Derrill Center, NP      . gabapentin (NEURONTIN) capsule 400 mg  400 mg Oral TID Merian Capron, MD      . hydrOXYzine (ATARAX/VISTARIL) tablet 25 mg  25 mg Oral Q6H PRN Derrill Center, NP      . loperamide (IMODIUM) capsule 2-4 mg  2-4 mg Oral PRN Derrill Center, NP      . magnesium hydroxide (MILK OF MAGNESIA) suspension 30 mL  30 mL Oral Daily PRN Rozetta Nunnery, NP      . methocarbamol (ROBAXIN) tablet 500 mg  500 mg Oral Q8H PRN Derrill Center, NP      . naproxen (NAPROSYN) tablet 500 mg  500 mg Oral BID PRN Derrill Center, NP      .  nicotine (NICODERM CQ - dosed in mg/24 hours) patch 21 mg  21 mg Transdermal Daily Rozetta Nunnery, NP   21 mg at 08/10/16 0939  . ondansetron (ZOFRAN-ODT) disintegrating tablet 4 mg  4 mg Oral Q6H PRN Derrill Center, NP      . QUEtiapine (SEROQUEL) tablet 200 mg  200 mg Oral QHS Rozetta Nunnery, NP       PTA Medications: Prescriptions Prior to Admission  Medication Sig Dispense Refill Last Dose  . amphetamine-dextroamphetamine (ADDERALL) 20 MG tablet Take 20 mg by mouth 2 (two) times daily.   Past Week at Unknown time  . Aspirin-Salicylamide-Caffeine (BC HEADACHE) 325-95-16 MG TABS Take 1 each by mouth 2 (two) times daily as needed (for headache).    Past Week at Unknown time  . gabapentin (NEURONTIN) 400 MG capsule Take 400 mg by mouth 4 (four) times daily.   Past Week at Unknown time  . methocarbamol (ROBAXIN) 500 MG tablet Take 2 tablets (1,000 mg total) by mouth every 4 (four) hours as  needed. (Patient taking differently: Take 1,000 mg by mouth every 4 (four) hours as needed for muscle spasms. ) 240 tablet 3 Past Week at Unknown time  . QUEtiapine (SEROQUEL) 200 MG tablet Take 200 mg by mouth at bedtime.    Past Week at Unknown time  . SUMAtriptan (IMITREX) 100 MG tablet Take 1 tablet (100 mg total) by mouth every 2 (two) hours as needed for migraine. May repeat in 2 hours if headache persists or recurs. 12 tablet 11 unk    Musculoskeletal: Strength & Muscle Tone: within normal limits Gait & Station: normal Patient leans: no lean  Psychiatric Specialty Exam: Physical Exam  Constitutional: He appears well-developed.  HENT:  Head: Normocephalic.    Review of Systems  Cardiovascular: Negative for chest pain.  Gastrointestinal: Negative for nausea.  Skin: Negative for rash.  Psychiatric/Behavioral: Positive for depression and substance abuse. The patient is nervous/anxious.     Blood pressure (!) 151/98, pulse 91, temperature 98.4 F (36.9 C), temperature source Oral, resp. rate 18,  height 5' 6" (1.676 m), weight 71.7 kg (158 lb), SpO2 100 %.Body mass index is 25.5 kg/m.  General Appearance: Casual  Eye Contact:  Fair  Speech:  Normal Rate  Volume:  Decreased  Mood:  Dysphoric  Affect:  Constricted and Depressed  Thought Process:  Goal Directed  Orientation:  Full (Time, Place, and Person)  Thought Content:  Tangential  Suicidal Thoughts:  No  Homicidal Thoughts:  No  Memory:  Immediate;   Fair Recent;   Fair  Judgement:  Poor  Insight:  Lacking  Psychomotor Activity:  Normal  Concentration:  Concentration: Fair and Attention Span: Fair  Recall:  [employer] of Knowledge:  Fair  Language:  Fair  Akathisia:  Negative  Handed:  Right  AIMS (if indicated):     Assets:  Desire for Improvement  ADL's:  Intact  Cognition:  WNL  Sleep:  Number of Hours: 2    Treatment Plan Summary: Daily contact with patient to assess and evaluate symptoms and progress in treatment, Medication management and Plan as follows  Observation Level/Precautions:  Detox 15 minute checks Seizure  Laboratory:  as needed  Psychotherapy:  As per group  Medications:  See chart  Consultations:    Discharge Concerns:  sobreity  Estimated LOS:5-7 days  Other:     Physician Treatment Plan for Primary Diagnosis: <principal problem not specified> Long Term Goal(s): Improvement in symptoms so as ready for discharge and sobriety  Short Term Goals: Ability to disclose and discuss suicidal ideas, Ability to demonstrate self-control will improve and Ability to maintain clinical measurements within normal limits will improve Detox off for opiate will start Continue neurontin tid as well. Physician Treatment Plan for Secondary Diagnosis: Active Problems:   Major depressive disorder, severe ([facility])  Long Term Goal(s): Improvement in symptoms so as ready for discharge and improve coping skills  Short Term Goals: Ability to disclose and discuss suicidal ideas, Ability to identify and develop  effective coping behaviors will improve, Ability to maintain clinical measurements within normal limits will improve and Compliance with prescribed medications will improve Will continue mood stabilizer of seroquel for now I certify that inpatient services furnished can reasonably be expected to improve the patient's condition.    Merian Capron, MD 12/3/201710:58 AM

## 2016-08-10 NOTE — Plan of Care (Signed)
Problem: Education: Goal: Knowledge of the prescribed therapeutic regimen will improve Outcome: Progressing Discussed ordered medications indication, schedules and possible side effects.

## 2016-08-11 DIAGNOSIS — F332 Major depressive disorder, recurrent severe without psychotic features: Secondary | ICD-10-CM

## 2016-08-11 DIAGNOSIS — T40601A Poisoning by unspecified narcotics, accidental (unintentional), initial encounter: Secondary | ICD-10-CM

## 2016-08-11 MED ORDER — METHOCARBAMOL 500 MG PO TABS
1000.0000 mg | ORAL_TABLET | Freq: Three times a day (TID) | ORAL | Status: DC | PRN
  Administered 2016-08-11 – 2016-08-12 (×2): 1000 mg via ORAL
  Filled 2016-08-11 (×2): qty 2

## 2016-08-11 NOTE — Progress Notes (Signed)
Recreation Therapy Notes  Date: 08/11/16 Time: 0930 Location: [address] Dayroom  Group Topic: Stress Management  Goal Area(s) Addresses:  Patient will verbalize importance of using healthy stress management.  Patient will identify positive emotions associated with healthy stress management.   Behavioral Response: Engaged  Intervention: Calm App  Activity :  LRT introduced the stress management concept of meditation.  LRT played a meditation on anxiety from the Calm App.  Patients were to follow along with the app to engage in the meditation.  Education:  Stress Management, Discharge Planning.   Education Outcome: Acknowledges edcuation/In group clarification offered/Needs additional education  Clinical Observations/Feedback: Pt attended group.   Caroll RancherMarjette Janus Vlcek, LRT/CTRS     Caroll RancherLindsay, Daiden Coltrane A 08/11/2016 11:27 AM

## 2016-08-11 NOTE — Progress Notes (Signed)
[facility] MD Progress Note  08/11/2016 1:47 PM  Patient Active Problem List   Diagnosis Date Noted  . Major depressive disorder, severe ([place]) 08/10/2016  . Migraine syndrome 06/20/2016  . Addiction to drug ([place]) 01/14/2016  . Adjustment reaction 01/14/2016  . Screening for lipoid disorders 05/21/2015  . Adverse effects of medication 09/05/2013  . ADD (attention deficit disorder) 06/28/2012  . Poor concentration 02/23/2012  . Routine general medical examination at a health care facility 02/12/2012  . Smoker 08/22/2011  . Chronic back pain 02/07/2010    Diagnosis: Opiate use disorder and history of depression.  Subjective: Patient admitted on an IVC over the weekend. He reports he took an overdose accidentally of heroin but denies intent to die and states he is not currently suicidal or homicidal and never has been. He does when asked about information in the chart that he told his family they should've just let him die after the overdose he stated he was feeling demoralized and discouraged at that time. Per chart patient has had approximately 8 accidental overdoses on heroin and with some questioning he does admit that this might be a problematic behavior. However he states he does not wish to remain in inpatient treatment at this time and he needs to be released for a court date tomorrow.  Objective: Well-developed well-nourished man in no apparent distress with piercings in place on both sides of the corners of his lower lip and mouth area and ear plug holes visible in both globes. He is pleasant and appropriate mood is described as all right affect is euthymic thought processes linear and goal-directed thought content denies current suicidal or homicidal ideation, plan or intent alert and oriented 3 judgment in some respects is limited IQ appears an average range         Current Facility-Administered Medications (Analgesics):  .  acetaminophen (TYLENOL) tablet 650 mg .  naproxen  (NAPROSYN) tablet 500 mg     Current Facility-Administered Medications (Other):  .  alum & mag hydroxide-simeth (MAALOX/MYLANTA) 200-200-20 MG/5ML suspension 30 mL .  dicyclomine (BENTYL) tablet 20 mg .  gabapentin (NEURONTIN) capsule 400 mg .  hydrOXYzine (ATARAX/VISTARIL) tablet 25 mg .  loperamide (IMODIUM) capsule 2-4 mg .  magnesium hydroxide (MILK OF MAGNESIA) suspension 30 mL .  methocarbamol (ROBAXIN) tablet 1,000 mg .  nicotine (NICODERM CQ - dosed in mg/24 hours) patch 21 mg .  ondansetron (ZOFRAN-ODT) disintegrating tablet 4 mg .  QUEtiapine (SEROQUEL) tablet 200 mg  No current outpatient prescriptions on file.  Vital Signs:Blood pressure 132/85, pulse (!) 103, temperature 97.6 F (36.4 C), temperature source Oral, resp. rate 16, height 5' 6" (1.676 m), weight 71.7 kg (158 lb), SpO2 100 %.    Lab Results:  Results for orders placed or performed during the hospital encounter of 08/09/16 (from the past 48 hour(s))  Comprehensive metabolic panel     Status: Abnormal   Collection Time: 08/09/16  6:45 PM  Result Value Ref Range   Sodium 140 135 - 145 mmol/L   Potassium 3.4 (L) 3.5 - 5.1 mmol/L   Chloride 100 (L) 101 - 111 mmol/L   CO2 32 22 - 32 mmol/L   Glucose, Bld 89 65 - 99 mg/dL   BUN 11 6 - 20 mg/dL   Creatinine, Ser 0.95 0.61 - 1.24 mg/dL   Calcium 9.4 8.9 - 10.3 mg/dL   Total Protein 7.6 6.5 - 8.1 g/dL   Albumin 4.8 3.5 - 5.0 g/dL   AST 22 15 -  41 U/L   ALT 32 17 - 63 U/L   Alkaline Phosphatase 73 38 - 126 U/L   Total Bilirubin 1.2 0.3 - 1.2 mg/dL   GFR calc non Af Amer >60 >60 mL/min   GFR calc Af Amer >60 >60 mL/min    Comment: (NOTE) The eGFR has been calculated using the CKD EPI equation. This calculation has not been validated in all clinical situations. eGFR's persistently <60 mL/min signify possible Chronic Kidney Disease.    Anion gap 8 5 - 15  Ethanol     Status: None   Collection Time: 08/09/16  6:45 PM  Result Value Ref Range   Alcohol,  Ethyl (B) <5 <5 mg/dL    Comment:        LOWEST DETECTABLE LIMIT FOR SERUM ALCOHOL IS 5 mg/dL FOR MEDICAL PURPOSES ONLY   cbc     Status: None   Collection Time: 08/09/16  6:45 PM  Result Value Ref Range   WBC 9.0 4.0 - 10.5 K/uL   RBC 5.49 4.22 - 5.81 MIL/uL   Hemoglobin 16.5 13.0 - 17.0 g/dL   HCT 47.9 39.0 - 52.0 %   MCV 87.2 78.0 - 100.0 fL   MCH 30.1 26.0 - 34.0 pg   MCHC 34.4 30.0 - 36.0 g/dL   RDW 12.7 11.5 - 15.5 %   Platelets 277 150 - 400 K/uL  Rapid urine drug screen (hospital performed)     Status: Abnormal   Collection Time: 08/09/16  9:51 PM  Result Value Ref Range   Opiates NONE DETECTED NONE DETECTED   Cocaine POSITIVE (A) NONE DETECTED   Benzodiazepines NONE DETECTED NONE DETECTED   Amphetamines POSITIVE (A) NONE DETECTED   Tetrahydrocannabinol NONE DETECTED NONE DETECTED   Barbiturates NONE DETECTED NONE DETECTED    Comment:        DRUG SCREEN FOR MEDICAL PURPOSES ONLY.  IF CONFIRMATION IS NEEDED FOR ANY PURPOSE, NOTIFY LAB WITHIN 5 DAYS.        LOWEST DETECTABLE LIMITS FOR URINE DRUG SCREEN Drug Class       Cutoff (ng/mL) Amphetamine      1000 Barbiturate      200 Benzodiazepine   235 Tricyclics       361 Opiates          300 Cocaine          300 THC              50     Physical Findings: AIMS: Facial and Oral Movements Muscles of Facial Expression: None, normal Lips and Perioral Area: None, normal Jaw: None, normal Tongue: None, normal,Extremity Movements Upper (arms, wrists, hands, fingers): None, normal Lower (legs, knees, ankles, toes): None, normal, Trunk Movements Neck, shoulders, hips: None, normal, Overall Severity Severity of abnormal movements (highest score from questions above): None, normal Incapacitation due to abnormal movements: None, normal Patient's awareness of abnormal movements (rate only patient's report): No Awareness, Dental Status Current problems with teeth and/or dentures?: Yes Does patient usually wear  dentures?: No  CIWA:  CIWA-Ar Total: 2 COWS:  COWS Total Score: 4   Assessment/Plan: Patient denies any suicidal or homicidal ideation, plan or intent now or in the past and requests to be discharged to outpatient follow-up. Patient has some limited insight into the risk taking behavior that he engages in but currently is not interested in any treatment for substances. Patient was counseled to avoid heroin or at least to sniff a little bit first if he  is going to do some to evaluate the strength and he was informed that we will sign him in as a voluntary patient but we will continue to monitor him over the next 24 hours and if he remains without suicidal or homicidal ideation, plan or intent and still has no interest in treatment he will be discharged tomorrow.   [place] , MD 08/11/2016, 1:47 PM  

## 2016-08-11 NOTE — Progress Notes (Signed)
Patient ID: Jacob RuddyJohnathan S Newton, male   DOB: 07/04/1986, 30 y.o.   MRN: [id number]  DAR: Pt. Denies SI/HI and A/V Hallucinations. He reports sleep is good, appetite is good, energy level is normal, and concentration is poor. He rates depression, hopelessness, and anxiety 0/10. Patient reports spasms and received PRN Robaxin. He reports this mg is not what he usually takes and stated he would speak to MD about this. Support and encouragement provided to the patient. Scheduled medications administered to patient per physician's orders. Patient is minimal and irritable during interaction. He is seen in the milieu interacting with peers. CPS and patient's wife came to the unit in order to have paperwork signed by patient. Patient was agreeable to this. No behavioral issues noted thus far. He denies withdrawal symptoms at this time stating, "I'm not withdrawaling from anything!" Q15 minute checks are maintained for safety.

## 2016-08-11 NOTE — [facility] Group Notes (Signed)
[facility] LCSW Group Therapy  08/11/2016 3:08 PM  Type of Therapy:  Group Therapy  Participation Level:  Did Not Attend-pt invited. Chose to remain in room.   Summary of Progress/Problems: Today's Topic: Overcoming Obstacles. Patients identified one short term goal and potential obstacles in reaching this goal. Patients processed barriers involved in overcoming these obstacles. Patients identified steps necessary for overcoming these obstacles and explored motivation (internal and external) for facing these difficulties head on.   Frederik Standley N Smart LCSW 08/11/2016, 3:08 PM

## 2016-08-11 NOTE — [facility] Group Notes (Signed)
Pt attended spiritual care group on grief and loss facilitated by chaplain Burnis KingfisherMatthew Desmon Hitchner   Group opened with brief discussion and psycho-social ed around grief and loss in relationships and in relation to self - identifying life patterns, circumstances, changes that cause losses. Established group norm of speaking from own life experience. Group goal of establishing open and affirming space for members to share loss and experience with grief, normalize grief experience and provide psycho social education and grief support.    Kasper was present throughout group.  He engaged in group when prompted by facilitator or another group member.  He stated [facility] primary [facility] and return to [place] where he had been working on [employer].  He described "only being home for a few days and then I was brought here in handcuffs."  Harshil did not relate losses in his life detailed in H&P (separation from spouse, loss of job, recent OD x2,)    Stated "they way I deal with losses is to keep going.  That's why its difficult to be in a place like this, stuff catches up to you"

## 2016-08-11 NOTE — [facility] Suicide Risk Assessment (Signed)
[facility] INPATIENT:  Family/Significant Other Suicide Prevention Education  Suicide Prevention Education:  Patient Refusal for Family/Significant Other Suicide Prevention Education: The patient Jacob Newton has refused to provide written consent for family/significant other to be provided Family/Significant Other Suicide Prevention Education during admission and/or prior to discharge.  Physician notified.  SPE completed with pt, as pt refused to consent to family contact. SPI pamphlet provided to pt and pt was encouraged to share information with support network, ask questions, and talk about any concerns relating to SPE. Pt denies access to guns/firearms and verbalized understanding of information provided. Mobile Crisis information also provided to pt.   Thomas Mabry N Smart LCSW 08/11/2016, 10:26 AM

## 2016-08-11 NOTE — [facility] Suicide Risk Assessment (Signed)
[facility]   Principal Problem: Overdose of opiate or related narcotic, accidental or unintentional, initial encounter Discharge Diagnoses:  Patient Active Problem List   Diagnosis Date Noted  . Overdose of opiate or related narcotic, accidental or unintentional, initial encounter [T40.601A] 08/11/2016  . Major depressive disorder, severe (HCC) [F32.2] 08/10/2016  . Migraine syndrome [G43.909] 06/20/2016  . Addiction to drug (HCC) [F19.20] 01/14/2016  . Adjustment reaction [F43.20] 01/14/2016  . Screening for lipoid disorders [Z13.220] 05/21/2015  . Adverse effects of medication [T88.7XXA] 09/05/2013  . ADD (attention deficit disorder) [F98.8] 06/28/2012  . Poor concentration [R41.840] 02/23/2012  . Routine general medical examination at a health care facility [Z00.00] 02/12/2012  . Smoker [F17.200] 08/22/2011  . Chronic back pain [M54.9, G89.29] 02/07/2010    Total Time spent with patient: 15 minutes  Musculoskeletal: Strength & Muscle Tone: within normal limits Gait & Station: normal Patient leans: N/A  Psychiatric Specialty Exam: ROS  Blood pressure 132/85, pulse (!) 103, temperature 97.6 F (36.4 C), temperature source Oral, resp. rate 16, height 5\' 6"  (1.676 m), weight 71.7 kg (158 lb), SpO2 100 %.Body mass index is 25.5 kg/m.  General Appearance: Casual  Eye Contact::  Good  Speech:  Clear and Coherent  Volume:  Normal  Mood:  Anxious  Affect:  Appropriate and Congruent  Thought Process:  Coherent  Orientation:  Full (Time, Place, and Person)  Thought Content:  Negative  Suicidal Thoughts:  No  Homicidal Thoughts:  No  Memory:  Negative  Judgement:  Fair  Insight:  Fair  Psychomotor Activity:  Normal  Concentration:  Good  Recall:  Good  Fund of Knowledge:Good  Language: Good  Akathisia:  No  Handed:  Right  AIMS (if indicated):     Assets:  Resilience  Sleep:  Number of Hours: 5.5  Cognition: WNL  ADL's:  Intact   Mental  Status Per Nursing Assessment::   On Admission:     Demographic Factors:  Male and Caucasian  Loss Factors: Financial problems/change in socioeconomic status  Historical Factors: Impulsivity  Risk Reduction Factors:   Sense of responsibility to family and Positive social support  Continued Clinical Symptoms:  Alcohol/Substance Abuse/Dependencies  Cognitive Features That Contribute To Risk:  None    Suicide Risk:  Mild:  Suicidal ideation of limited frequency, intensity, duration, and specificity.  There are no identifiable plans, no associated intent, mild dysphoria and related symptoms, good self-control (both objective and subjective assessment), few other risk factors, and identifiable protective factors, including available and accessible social support.  Follow-up Information    [facility] Follow up on 08/13/2016.   Why:  Appt on this date at 11:00AM to meet with Mr [facility]. Contact information: [address] [place], [place] [address] Phone: [phone] Fax: [phone]          Plan Of Care/Follow-up recommendations:  Other:  Patient denies any suicidal or homicidal ideation, plan or intent. He is encouraged to abstain from using heroin as he reports repeated accidental overdoses and encouraged to seek further help for substance use disorders if necessary in the community.  Acquanetta SitElizabeth Woods Bravery Ketcham, MD 08/11/2016, 3:16 PM

## 2016-08-11 NOTE — Progress Notes (Signed)
The patient attended this evening's A. A. Meeting and was appropriate.  

## 2016-08-11 NOTE — Tx Team (Signed)
Interdisciplinary Treatment and Diagnostic Plan Update  08/11/2016 Time of Session: 9:30AM Jacob Newton MRN: [id number]  Principal Diagnosis: Overdose of opiate or related narcotic, accidental or unintentional, initial encounter  Secondary Diagnoses: Principal Problem:   Overdose of opiate or related narcotic, accidental or unintentional, initial encounter   Current Medications:  Current Facility-Administered Medications  Medication Dose Route Frequency Provider Last Rate Last Dose  . acetaminophen (TYLENOL) tablet 650 mg  650 mg Oral Q6H PRN Jackelyn Poling, NP   650 mg at 08/10/16 2152  . alum & mag hydroxide-simeth (MAALOX/MYLANTA) 200-200-20 MG/5ML suspension 30 mL  30 mL Oral Q4H PRN Jackelyn Poling, NP      . dicyclomine (BENTYL) tablet 20 mg  20 mg Oral Q6H PRN Oneta Rack, NP      . gabapentin (NEURONTIN) capsule 400 mg  400 mg Oral TID Thresa Ross, MD   400 mg at 08/11/16 1126  . hydrOXYzine (ATARAX/VISTARIL) tablet 25 mg  25 mg Oral Q6H PRN Oneta Rack, NP   25 mg at 08/10/16 1700  . loperamide (IMODIUM) capsule 2-4 mg  2-4 mg Oral PRN Oneta Rack, NP      . magnesium hydroxide (MILK OF MAGNESIA) suspension 30 mL  30 mL Oral Daily PRN Jackelyn Poling, NP      . methocarbamol (ROBAXIN) tablet 1,000 mg  1,000 mg Oral Q8H PRN Acquanetta Sit, MD      . naproxen (NAPROSYN) tablet 500 mg  500 mg Oral BID PRN Oneta Rack, NP      . nicotine (NICODERM CQ - dosed in mg/24 hours) patch 21 mg  21 mg Transdermal Daily Jackelyn Poling, NP   21 mg at 08/11/16 0900  . ondansetron (ZOFRAN-ODT) disintegrating tablet 4 mg  4 mg Oral Q6H PRN Oneta Rack, NP      . QUEtiapine (SEROQUEL) tablet 200 mg  200 mg Oral QHS Jackelyn Poling, NP   200 mg at 08/10/16 2144   PTA Medications: Prescriptions Prior to Admission  Medication Sig Dispense Refill Last Dose  . amphetamine-dextroamphetamine (ADDERALL) 20 MG tablet Take 20 mg by mouth 2 (two) times daily.   Past Week at Unknown  time  . Aspirin-Salicylamide-Caffeine (BC HEADACHE) 325-95-16 MG TABS Take 1 each by mouth 2 (two) times daily as needed (for headache).    Past Week at Unknown time  . gabapentin (NEURONTIN) 400 MG capsule Take 400 mg by mouth 4 (four) times daily.   Past Week at Unknown time  . methocarbamol (ROBAXIN) 500 MG tablet Take 2 tablets (1,000 mg total) by mouth every 4 (four) hours as needed. (Patient taking differently: Take 1,000 mg by mouth every 4 (four) hours as needed for muscle spasms. ) 240 tablet 3 Past Week at Unknown time  . QUEtiapine (SEROQUEL) 200 MG tablet Take 200 mg by mouth at bedtime.    Past Week at Unknown time  . SUMAtriptan (IMITREX) 100 MG tablet Take 1 tablet (100 mg total) by mouth every 2 (two) hours as needed for migraine. May repeat in 2 hours if headache persists or recurs. 12 tablet 11 unk    Patient Stressors: Financial difficulties Marital or family conflict Occupational concerns Substance abuse  Patient Strengths: Ability for insight Active sense of humor Average or above average intelligence Communication skills  Treatment Modalities: Medication Management, Group therapy, Case management,  1 to 1 session with clinician, Psychoeducation, Recreational therapy.   Physician Treatment Plan for Primary Diagnosis:  Overdose of opiate or related narcotic, accidental or unintentional, initial encounter Long Term Goal(s): Improvement in symptoms so as ready for discharge sobriety Improvement in symptoms so as ready for discharge improve coping skills   Short Term Goals: Ability to disclose and discuss suicidal ideas Ability to demonstrate self-control will improve Ability to maintain clinical measurements within normal limits will improve Ability to disclose and discuss suicidal ideas Ability to identify and develop effective coping behaviors will improve Ability to maintain clinical measurements within normal limits will improve Compliance with prescribed  medications will improve  Medication Management: Evaluate patient's response, side effects, and tolerance of medication regimen.  Therapeutic Interventions: 1 to 1 sessions, Unit Group sessions and Medication administration.  Evaluation of Outcomes: Progressing  Physician Treatment Plan for Secondary Diagnosis: Principal Problem:   Overdose of opiate or related narcotic, accidental or unintentional, initial encounter  Long Term Goal(s): Improvement in symptoms so as ready for discharge sobriety Improvement in symptoms so as ready for discharge improve coping skills   Short Term Goals: Ability to disclose and discuss suicidal ideas Ability to demonstrate self-control will improve Ability to maintain clinical measurements within normal limits will improve Ability to disclose and discuss suicidal ideas Ability to identify and develop effective coping behaviors will improve Ability to maintain clinical measurements within normal limits will improve Compliance with prescribed medications will improve     Medication Management: Evaluate patient's response, side effects, and tolerance of medication regimen.  Therapeutic Interventions: 1 to 1 sessions, Unit Group sessions and Medication administration.  Evaluation of Outcomes: Progressing   RN Treatment Plan for Primary Diagnosis: Overdose of opiate or related narcotic, accidental or unintentional, initial encounter Long Term Goal(s): Knowledge of disease and therapeutic regimen to maintain health will improve  Short Term Goals: Ability to remain free from injury will improve, Ability to disclose and discuss suicidal ideas and Ability to identify and develop effective coping behaviors will improve  Medication Management: RN will administer medications as ordered by provider, will assess and evaluate patient's response and provide education to patient for prescribed medication. RN will report any adverse and/or side effects to prescribing  provider.  Therapeutic Interventions: 1 on 1 counseling sessions, Psychoeducation, Medication administration, Evaluate responses to treatment, Monitor vital signs and CBGs as ordered, Perform/monitor CIWA, COWS, AIMS and Fall Risk screenings as ordered, Perform wound care treatments as ordered.  Evaluation of Outcomes: Progressing   LCSW Treatment Plan for Primary Diagnosis: Overdose of opiate or related narcotic, accidental or unintentional, initial encounter Long Term Goal(s): Safe transition to appropriate next level of care at discharge, Engage patient in therapeutic group addressing interpersonal concerns.  Short Term Goals: Engage patient in aftercare planning with referrals and resources, Facilitate patient progression through stages of change regarding substance use diagnoses and concerns and Identify triggers associated with mental health/substance abuse issues  Therapeutic Interventions: Assess for all discharge needs, 1 to 1 time with Social worker, Explore available resources and support systems, Assess for adequacy in community support network, Educate family and significant other(s) on suicide prevention, Complete Psychosocial Assessment, Interpersonal group therapy.  Evaluation of Outcomes: Progressing   Progress in Treatment: Attending groups: Yes. Participating in groups: Yes. Taking medication as prescribed: Yes. Toleration medication: Yes. Family/Significant other contact made: No, will contact:  family member if patient consents Patient understands diagnosis: Yes. Discussing patient identified problems/goals with staff: Yes. Medical problems stabilized or resolved: Yes. Denies suicidal/homicidal ideation: Yes. Issues/concerns per patient self-inventory: No. Other: n/a   New problem(s) identified: Yes,  Describe:  pt demonstrates poor insight into substance use  New Short Term/Long Term Goal(s): medication stabilization; detox; development of comprehensive mental  wellness/sobriety plan.   Discharge Plan or Barriers:  CSW assessing for appropriate referrals pt [facility] but relapsed 4 days later. He has been kicked out of [facility] due to two failed drug tests.   Reason for Continuation of Hospitalization: Depression Medication stabilization Withdrawal symptoms  Estimated Length of Stay: 2-3 days   Attendees: Patient: 08/11/2016 3:25 PM  Physician: Dr. Mckinley Jewelates MD 08/11/2016 3:25 PM  Nursing: Leighton ParodyBritney Tyson RN; Marcell Barlowhrista Dobson RN 08/11/2016 3:25 PM  RN Care Manager: Onnie BoerJennifer Clark CM 08/11/2016 3:25 PM  Social Worker: Trula SladeHeather Smart, LCSW; [place] Dr[place] LCSW 08/11/2016 3:25 PM  Recreational Therapist:  08/11/2016 3:25 PM  Other: Gray BernhardtMay Augustin NP; Hillery Jacksanika Lewis NP 08/11/2016 3:25 PM  Other:  08/11/2016 3:25 PM  Other: 08/11/2016 3:25 PM    Scribe for Treatment Team: Ledell PeoplesHeather N Smart, LCSW 08/11/2016 3:25 PM

## 2016-08-11 NOTE — BHH Counselor (Signed)
Adult Comprehensive Assessment  Patient ID: Jacob Newton, male   DOB: 10/25/1985, 30 y.o.   MRN: [id number]  Information Source: Information source: Patient  Current Stressors:  Educational / Learning stressors: quit highschool one month before graduation Employment / Job issues: pt reports that he was recently fired from job after returning from [place] for employer "but I have another job lined up." Family Relationships: poor-recent separation from wife; poor relationship with 5 kids who pt reports have been removed from home by CPS due to his substance abuse. Financial / Lack of resources (include bankruptcy): currently unemployed; [employer] insurance Housing / Lack of housing: lives at home with his wife with whom he is separated Physical health (include injuries & life threatening diseases): none identified by pt Social relationships: poor-pt denies any close friends Substance abuse: pt minimizes substance use "I only used twice since coming back from [place] IV heroin/crack cocaine. benzo abuse, THC. long history of opiate addiction in particular Bereavement / Loss: recent separation from wife x1 week ago.   Living/Environment/Situation:  Living Arrangements: Spouse/significant other Living conditions (as described by patient or guardian): chaotic; temporary-he and wife are currently separated. "alot of tension" in the home.  How long has patient lived in current situation?: 10 years  What is atmosphere in current home: Chaotic, Temporary  Family History:  Marital status: Separated Separated, when?: 1 month ago What types of issues is patient dealing with in the relationship?: pt blames wife for his hospitalization. "I didn't mean to overdose but she does't belive me." Additional relationship information: long term substance abuse issues have taken toll on their 10 yr marriage. their five children were recently removed from the home by CPS and are living with their  grandmother. Are you sexually active?: Yes What is your sexual orientation?: heterosexual  Has your sexual activity been affected by drugs, alcohol, medication, or emotional stress?: yes Does patient have children?: Yes How many children?: 5 How is patient's relationship with their children?: ages range from 5 to 14-"CPS was involved and took them from the home. They live with my wife's mother." fair relationship with the kids.   Childhood History:  By whom was/is the patient raised?: Both parents Additional childhood history information: "I had a pretty good childhood." pt reports parents did not suffer from mental health or substance abuse issues. Description of patient's relationship with caregiver when they were a child: close to both parents  Patient's description of current relationship with people who raised him/her: distant relationship with both parents How were you disciplined when you got in trouble as a child/adolescent?: n/a  Does patient have siblings?: Yes Number of Siblings: 2 Description of patient's current relationship with siblings: middle child. brother is mentally disabled per pt. sister and pt have a "good" relationship.  Did patient suffer any verbal/emotional/physical/sexual abuse as a child?: No Did patient suffer from severe childhood neglect?: No Has patient ever been sexually abused/assaulted/raped as an adolescent or adult?: No Was the patient ever a victim of a crime or a disaster?: No Witnessed domestic violence?: No Has patient been effected by domestic violence as an adult?: No  Education:  Highest grade of school patient has completed: pt reports that he quit high school one month before graduating. "stupid move on my part."  Currently a student?: No Name of school: n/a  Learning disability?: No  Employment/Work Situation:   Employment situation: Unemployed Patient's job has been impacted by current illness: Yes Describe how patient's job has been  impacted: pt reports that he was fired upon returning from [place] for work--but does not mention a reason or connect this job loss to his substance abuse. What is the longest time patient has a held a job?: few years  Where was the patient employed at that time?: [employer], occupational.  Has patient ever been in the [employer]?: No Has patient ever served in combat?: No Did You Receive Any Psychiatric Treatment/Services While in the [employer]?: No Are There Guns or Other Weapons in Your Home?: No Are These Weapons Safely Secured?:  (n/a)  Financial Resources:   Financial resources: Income from spouse, Private insurance Does patient have a representative payee or guardian?: No  Alcohol/Substance Abuse:   What has been your use of drugs/alcohol within the last 12 months?: Pt reports "I only used twice since I've been back from [place] which was a 2 month trip." Pt reports he relapsed on heroin/cocaine (IV) and "accidently overdosed both times." Pt minimizes substance use and blames his wife for this admission due to IVC. pt positive for benzos and THC as well. long history of opiate addiction.  If attempted suicide, did drugs/alcohol play a role in this?: No (although pt reports several overdoses, he reports "they were all accidental." ) Alcohol/Substance Abuse Treatment Hx: Past Tx, Inpatient, Past Tx, Outpatient If yes, describe treatment: Completed 30 days at [facility] in Sept 2018; recently kicked out of [facility] due to two failed drug tests.  Has alcohol/substance abuse ever caused legal problems?: No (pt has traffic violation court date on 12/5 and has requested discharge on 12/5 AM)  Social Support System:   Patient's Community Support System: Poor Describe Community Support System: pt reports no positive supports or positive friendships Type of faith/religion: n/a  How does patient's faith help to cope with current illness?: n/a   Leisure/Recreation:   Leisure  and Hobbies: Pt denies hobbies (minimal during assessment)   Strengths/Needs:   What things does the patient do well?: "hard worker"  In what areas does patient struggle / problems for patient: pt demonstrates minimal insight; precontemplative stage   Discharge Plan:   Does patient have access to transportation?: Yes (car and license. pt reports that his wife would pick him up at discharge ) Will patient be returning to same living situation after discharge?: Yes (home ) Currently receiving community mental health services: No (pt recently discharged from the [facility] due to 2 failed drug tests and is open to a new provider) If no, would patient like referral for services when discharged?: Yes (What county?) ([place]; unable to return to the [facility]) Does patient have financial barriers related to discharge medications?: No [employer] and no income currently.)  Summary/Recommendations:   Summary and Recommendations (to be completed by the evaluator): Patient is 30 yo male living in [place], [place] Pt reports that he is recently separated from his wife and has 5 children who currently do not live in the home. Patient reports long history of opoid addiction with recent relapse and per pt, "unintenional overdose" on heroin. Patient reports several overdoses in his lifetime and was recently at [facility]/completed program. He reports being unable to return to the [facility] for SAIOP due to failed drug tests. Patient plans to return home and states that he has work lined up but was recently fired from current position. Pt reports having a court date for a traffic violation on 12/5 and would like to discharge 12/5 AM.  He is agreeable to referral for SAIOP/med management in the [place] area. CSW assessing for appropriate referrals. Recommendations for patient include: crisis stabilization, therapeutic milieu, encourage group attendance and participation,  medication management for mood stabilization, and development of comprehensive mental wellness/sobriety plan.   Jacob PeoplesHeather N Smart LCSW 08/11/2016 10:25 AM

## 2016-08-11 NOTE — Progress Notes (Signed)
Patient ID: Jacob Newton, male   DOB: 01/02/1986, 30 y.o.   MRN: [id number]  Pt currently presents with a masked affect and anxious behavior. Pt presents superficially upon first contact, resistant to care. Pt reports to Clinical research associatewriter that their goal is to "go home tomorrow." Pt states "my ex is leaving the house tomorrow." Pt reports good sleep with current medication regimen.   Pt provided with medications per providers orders. Pt's labs and vitals were monitored throughout the night. Pt supported emotionally and encouraged to express concerns and questions. Pt educated on medications.  Pt's safety ensured with 15 minute and environmental checks. Pt currently denies SI/HI and A/V hallucinations. Pt verbally agrees to seek staff if SI/HI or A/VH occurs and to consult with staff before acting on any harmful thoughts. Will continue POC.  Pt denies having completed the suicidal safety plan, encouraged to complete this before discharge. Becomes tearful during this discussion. Pt reports that his sister and his mother provide positive sober support for him. Pt says "that is a good idea" in reference to completing the plan. Will follow up and report to oncoming nurse.

## 2016-08-12 MED ORDER — HYDROXYZINE HCL 25 MG PO TABS: 25.0000 mg | ORAL_TABLET | Freq: Four times a day (QID) | ORAL | 0 refills | Status: DC | PRN

## 2016-08-12 MED ORDER — NICOTINE 21 MG/24HR TD PT24: 21.0000 mg | MEDICATED_PATCH | Freq: Every day | TRANSDERMAL | 0 refills | Status: DC

## 2016-08-12 MED ORDER — QUETIAPINE FUMARATE 200 MG PO TABS: 200.0000 mg | ORAL_TABLET | Freq: Every day | ORAL | 0 refills | Status: DC

## 2016-08-12 MED ORDER — GABAPENTIN 400 MG PO CAPS: 400.0000 mg | ORAL_CAPSULE | Freq: Three times a day (TID) | ORAL | 0 refills | Status: DC

## 2016-08-12 NOTE — [facility] Group Notes (Signed)

## 2016-08-12 NOTE — Progress Notes (Signed)
  [facility] :  Will you be returning to the same living situation after discharge:  Yes,  home At discharge, do you have transportation home?: Yes,  family member Do you have the ability to pay for your medications: Yes,  [employer] insurance  Release of information consent forms completed and submitted to medical records by CSW.  Patient to Follow up at: Follow-up Information    [facility] Follow up on 08/13/2016.   Why:  Appt on this date at 11:00AM to meet with Mr [facility]. Contact information: [address] [place], [place] [address] Phone: [phone] Fax: [phone]          Next level of care provider has access to [facility] Link:no  Safety Planning and Suicide Prevention discussed: Yes,  SPE completed with pt; pt declined to consent to family contact.  Have you used any form of tobacco in the last 30 days? (Cigarettes, Smokeless Tobacco, Cigars, and/or Pipes): Yes  Has patient been referred to the Quitline?: Patient refused referral  Patient has been referred for addiction treatment: Yes  Maziah Smola N Smart LCSW 08/12/2016, 9:15 AM

## 2016-08-12 NOTE — Progress Notes (Signed)
Patient discharged home. DC instructions provided and explained. Medications reviewed. Rx given. Transition and suicide risk assessment given. Verbalization of understanding reported. Denies SI, HI, AVH. Belongings returned from [facility]. All questions answered, pt stable at discharge, left ambulatory with family member.

## 2016-08-12 NOTE — Discharge Summary (Signed)
Physician Discharge Summary Note  Patient:  Jacob Newton is an 30 y.o., male MRN:  [id number] DOB:  Nov 18, 1985 Patient phone:  [phone] (home)  Patient address:   [address] [place] [address],  Total Time spent with patient: 30 minutes  Date of Admission:  08/10/2016 Date of Discharge: 08/12/2016  Reason for Admission:  Substance abuse, suicidal ideations  Principal Problem: Overdose of opiate or related narcotic, accidental or unintentional, initial encounter Discharge Diagnoses: Patient Active Problem List   Diagnosis Date Noted  . Overdose of opiate or related narcotic, accidental or unintentional, initial encounter [T40.601A] 08/11/2016  . Major depressive disorder, severe (HCC) [F32.2] 08/10/2016  . Migraine syndrome [G43.909] 06/20/2016  . Addiction to drug (HCC) [F19.20] 01/14/2016  . Adjustment reaction [F43.20] 01/14/2016  . Screening for lipoid disorders [Z13.220] 05/21/2015  . Adverse effects of medication [T88.7XXA] 09/05/2013  . ADD (attention deficit disorder) [F98.8] 06/28/2012  . Poor concentration [R41.840] 02/23/2012  . Routine general medical examination at a health care facility [Z00.00] 02/12/2012  . Smoker [F17.200] 08/22/2011  . Chronic back pain [M54.9, G89.29] 02/07/2010    Past Psychiatric History: see HPI  Past Medical History:  Past Medical History:  Diagnosis Date  . ADHD (attention deficit hyperactivity disorder)   . Back pain    chronic low back pain, with L%-S1 stenosis/bulge sisc/arthropathy  . Closed lumbar fx w/ cord inj 2010   hx lumbar fx  . Opiate abuse, continuous   . Reactive airways dysfunction syndrome    with illnesses  . Viral meningitis 7/15   History reviewed. No pertinent surgical history. Family History:  Family History  Problem Relation Age of Onset  . Hypertension Mother   . Diabetes Mother    Family Psychiatric  History: see HPI Social History:  History  Alcohol Use No    Comment: rarely on  holidays     History  Drug Use  . Types: IV, Heroin, Cocaine    Comment: opiates, heroin    Social History   Social History  . Marital status: Married    Spouse name: N/A  . Number of children: N/A  . Years of education: N/A   Social History Main Topics  . Smoking status: Current Every Day Smoker    Packs/day: 1.00    Types: E-cigarettes  . Smokeless tobacco: Never Used     Comment: vape   . Alcohol use No     Comment: rarely on holidays  . Drug use:     Types: IV, Heroin, Cocaine     Comment: opiates, heroin  . Sexual activity: Yes    Birth control/ protection: None   Other Topics Concern  . None   Social History Narrative  . None    Hospital Course:  Jacob Newton is an 30 y.o. separated male who presented initially to [facility] ED after his wife, petitioned for involuntary commitment. Affidavit and petition states: "Resp diagnosis ADHD "Lower back pain"; Doctor took resp off opioids; current meds (Adderall, Gabapentin, Roboxan, Suboxone) overdosed on heroin Monday (11/27), told wife and 64 yr old daughter that he wished they had left him die; Overdosed again today (12/02); suicidal, dangerous to himself, substance abuser."  Jacob Newton was admitted for Overdose of opiate or related narcotic, accidental or unintentional, initial encounter and crisis management.  Patient was treated with medications with their indications listed below in detail under Medication List.  Medical problems were identified and treated as needed.  Home medications were restarted  as appropriate.  Improvement was monitored by observation and Jacob Newton daily report of symptom reduction.  Emotional and mental status was monitored by daily self inventory reports completed by Jacob Newton and clinical staff.  Patient reported continued improvement, denied any new concerns.  Patient had been compliant on medications and denied side effects. Support and encouragement was  provided.          Jacob Newton was evaluated by the treatment team for stability and plans for continued recovery upon discharge.  Patient was offered further treatment options upon discharge including Residential, Intensive Outpatient and Outpatient treatment. Patient will follow up with agency listed below for medication management and counseling.  Encouraged patient to maintain satisfactory support network and home environment.  Advised to adhere to medication compliance and outpatient treatment follow up.  Prescriptions provided.       Jacob Newton motivation was an integral factor for scheduling further treatment.  Employment, transportation, bed availability, health status, family support, and any pending legal issues were also considered during patient's hospital stay.  Upon completion of this admission the patient was both mentally and medically stable for discharge denying suicidal/homicidal ideation, auditory/visual/tactile hallucinations, delusional thoughts and paranoia.      Physical Findings: AIMS: Facial and Oral Movements Muscles of Facial Expression: None, normal Lips and Perioral Area: None, normal Jaw: None, normal Tongue: None, normal,Extremity Movements Upper (arms, wrists, hands, fingers): None, normal Lower (legs, knees, ankles, toes): None, normal, Trunk Movements Neck, shoulders, hips: None, normal, Overall Severity Severity of abnormal movements (highest score from questions above): None, normal Incapacitation due to abnormal movements: None, normal Patient's awareness of abnormal movements (rate only patient's report): No Awareness, Dental Status Current problems with teeth and/or dentures?: Yes Does patient usually wear dentures?: No  CIWA:  CIWA-Ar Total: 2 COWS:  COWS Total Score: 1  Musculoskeletal: Strength & Muscle Tone: within normal limits Gait & Station: normal Patient leans: N/A  Psychiatric Specialty Exam:  See MD SRA Physical Exam   Nursing note and vitals reviewed.   ROS  Blood pressure (!) 122/92, pulse (!) 103, temperature 97.6 F (36.4 C), temperature source Oral, resp. rate 18, height 5\' 6"  (1.676 m), weight 71.7 kg (158 lb), SpO2 100 %.Body mass index is 25.5 kg/m.    Have you used any form of tobacco in the last 30 days? (Cigarettes, Smokeless Tobacco, Cigars, and/or Pipes): Yes  Has this patient used any form of tobacco in the last 30 days? (Cigarettes, Smokeless Tobacco, Cigars, and/or Pipes) Yes, RX given  Blood Alcohol level:  Lab Results  Component Value Date   ETH <5 08/09/2016   ETH <5 05/22/2016    Metabolic Disorder Labs:  No results found for: HGBA1C, MPG No results found for: PROLACTIN Lab Results  Component Value Date   CHOL 153 05/21/2015   TRIG 211.0 (H) 05/21/2015   HDL 36.40 (L) 05/21/2015   CHOLHDL 4 05/21/2015   VLDL 42.2 (H) 05/21/2015   LDLCALC 73 02/13/2012    See Psychiatric Specialty Exam and Suicide Risk Assessment completed by Attending Physician prior to discharge.  Discharge destination:  Home  Is patient on multiple antipsychotic therapies at discharge:  No   Has Patient had three or more failed trials of antipsychotic monotherapy by history:  No  Recommended Plan for Multiple Antipsychotic Therapies: NA     Medication List    STOP taking these medications   amphetamine-dextroamphetamine 20 MG tablet Commonly known as:  ADDERALL  BC HEADACHE 325-95-16 MG Tabs Generic drug:  Aspirin-Salicylamide-Caffeine   methocarbamol 500 MG tablet Commonly known as:  ROBAXIN   SUMAtriptan 100 MG tablet Commonly known as:  IMITREX     TAKE these medications     Indication  gabapentin 400 MG capsule Commonly known as:  NEURONTIN Take 1 capsule (400 mg total) by mouth 3 (three) times daily. What changed:  when to take this  Indication:  Agitation   hydrOXYzine 25 MG tablet Commonly known as:  ATARAX/VISTARIL Take 1 tablet (25 mg total) by mouth every 6  (six) hours as needed for anxiety.  Indication:  Anxiety Neurosis   nicotine 21 mg/24hr patch Commonly known as:  NICODERM CQ - dosed in mg/24 hours Place 1 patch (21 mg total) onto the skin daily. Start taking on:  08/13/2016  Indication:  Nicotine Addiction   QUEtiapine 200 MG tablet Commonly known as:  SEROQUEL Take 1 tablet (200 mg total) by mouth at bedtime.  Indication:  Depressive Phase of Manic-Depression      Follow-up Information    [facility] Follow up on 08/13/2016.   Why:  Appt on this date at 11:00AM to meet with Mr [facility]. Contact information: [address] [place], [place] [address] Phone: [phone] Fax: [phone]          Follow-up recommendations:  Activity:  as tol Diet:  as tol  Comments:  1.  Take all your medications as prescribed.   2.  Report any adverse side effects to outpatient provider. 3.  Patient instructed to not use alcohol or illegal drugs while on prescription medicines. 4.  In the event of worsening symptoms, instructed patient to call 911, the crisis hotline or go to nearest emergency room for evaluation of symptoms.  Signed: Lindwood QuaSheila May Florentine Diekman, NP [facility] 08/12/2016, 9:47 AM

## 2016-08-12 NOTE — Tx Team (Signed)
Interdisciplinary Treatment and Diagnostic Plan Update  08/12/2016 Time of Session: 9:30AM Jacob Newton MRN: [id number]  Principal Diagnosis: Overdose of opiate or related narcotic, accidental or unintentional, initial encounter  Secondary Diagnoses: Principal Problem:   Overdose of opiate or related narcotic, accidental or unintentional, initial encounter   Current Medications:  Current Facility-Administered Medications  Medication Dose Route Frequency Provider Last Rate Last Dose  . acetaminophen (TYLENOL) tablet 650 mg  650 mg Oral Q6H PRN Rozetta Nunnery, NP   650 mg at 08/11/16 2151  . alum & mag hydroxide-simeth (MAALOX/MYLANTA) 200-200-20 MG/5ML suspension 30 mL  30 mL Oral Q4H PRN Rozetta Nunnery, NP      . dicyclomine (BENTYL) tablet 20 mg  20 mg Oral Q6H PRN Derrill Center, NP      . gabapentin (NEURONTIN) capsule 400 mg  400 mg Oral TID Merian Capron, MD   400 mg at 08/12/16 0828  . hydrOXYzine (ATARAX/VISTARIL) tablet 25 mg  25 mg Oral Q6H PRN Derrill Center, NP   25 mg at 08/10/16 1700  . loperamide (IMODIUM) capsule 2-4 mg  2-4 mg Oral PRN Derrill Center, NP      . magnesium hydroxide (MILK OF MAGNESIA) suspension 30 mL  30 mL Oral Daily PRN Rozetta Nunnery, NP      . methocarbamol (ROBAXIN) tablet 1,000 mg  1,000 mg Oral Q8H PRN Linard Millers, MD   1,000 mg at 08/12/16 9179  . naproxen (NAPROSYN) tablet 500 mg  500 mg Oral BID PRN Derrill Center, NP      . nicotine (NICODERM CQ - dosed in mg/24 hours) patch 21 mg  21 mg Transdermal Daily Rozetta Nunnery, NP   21 mg at 08/12/16 1505  . ondansetron (ZOFRAN-ODT) disintegrating tablet 4 mg  4 mg Oral Q6H PRN Derrill Center, NP      . QUEtiapine (SEROQUEL) tablet 200 mg  200 mg Oral QHS Rozetta Nunnery, NP   200 mg at 08/11/16 2149   PTA Medications: Prescriptions Prior to Admission  Medication Sig Dispense Refill Last Dose  . amphetamine-dextroamphetamine (ADDERALL) 20 MG tablet Take 20 mg by mouth 2 (two) times daily.    Past Week at Unknown time  . Aspirin-Salicylamide-Caffeine (BC HEADACHE) 325-95-16 MG TABS Take 1 each by mouth 2 (two) times daily as needed (for headache).    Past Week at Unknown time  . gabapentin (NEURONTIN) 400 MG capsule Take 400 mg by mouth 4 (four) times daily.   Past Week at Unknown time  . methocarbamol (ROBAXIN) 500 MG tablet Take 2 tablets (1,000 mg total) by mouth every 4 (four) hours as needed. (Patient taking differently: Take 1,000 mg by mouth every 4 (four) hours as needed for muscle spasms. ) 240 tablet 3 Past Week at Unknown time  . QUEtiapine (SEROQUEL) 200 MG tablet Take 200 mg by mouth at bedtime.    Past Week at Unknown time  . SUMAtriptan (IMITREX) 100 MG tablet Take 1 tablet (100 mg total) by mouth every 2 (two) hours as needed for migraine. May repeat in 2 hours if headache persists or recurs. 12 tablet 11 unk    Patient Stressors: Financial difficulties Marital or family conflict Occupational concerns Substance abuse  Patient Strengths: Ability for insight Active sense of humor Average or above average intelligence Communication skills  Treatment Modalities: Medication Management, Group therapy, Case management,  1 to 1 session with clinician, Psychoeducation, Recreational therapy.   Physician Treatment Plan  for Primary Diagnosis: Overdose of opiate or related narcotic, accidental or unintentional, initial encounter Long Term Goal(s): Improvement in symptoms so as ready for discharge sobriety Improvement in symptoms so as ready for discharge improve coping skills   Short Term Goals: Ability to disclose and discuss suicidal ideas Ability to demonstrate self-control will improve Ability to maintain clinical measurements within normal limits will improve Ability to disclose and discuss suicidal ideas Ability to identify and develop effective coping behaviors will improve Ability to maintain clinical measurements within normal limits will improve Compliance  with prescribed medications will improve  Medication Management: Evaluate patient's response, side effects, and tolerance of medication regimen.  Therapeutic Interventions: 1 to 1 sessions, Unit Group sessions and Medication administration.  Evaluation of Outcomes: Met  Physician Treatment Plan for Secondary Diagnosis: Principal Problem:   Overdose of opiate or related narcotic, accidental or unintentional, initial encounter  Long Term Goal(s): Improvement in symptoms so as ready for discharge sobriety Improvement in symptoms so as ready for discharge improve coping skills   Short Term Goals: Ability to disclose and discuss suicidal ideas Ability to demonstrate self-control will improve Ability to maintain clinical measurements within normal limits will improve Ability to disclose and discuss suicidal ideas Ability to identify and develop effective coping behaviors will improve Ability to maintain clinical measurements within normal limits will improve Compliance with prescribed medications will improve     Medication Management: Evaluate patient's response, side effects, and tolerance of medication regimen.  Therapeutic Interventions: 1 to 1 sessions, Unit Group sessions and Medication administration.  Evaluation of Outcomes: Met   RN Treatment Plan for Primary Diagnosis: Overdose of opiate or related narcotic, accidental or unintentional, initial encounter Long Term Goal(s): Knowledge of disease and therapeutic regimen to maintain health will improve  Short Term Goals: Ability to remain free from injury will improve, Ability to disclose and discuss suicidal ideas and Ability to identify and develop effective coping behaviors will improve  Medication Management: RN will administer medications as ordered by provider, will assess and evaluate patient's response and provide education to patient for prescribed medication. RN will report any adverse and/or side effects to prescribing  provider.  Therapeutic Interventions: 1 on 1 counseling sessions, Psychoeducation, Medication administration, Evaluate responses to treatment, Monitor vital signs and CBGs as ordered, Perform/monitor CIWA, COWS, AIMS and Fall Risk screenings as ordered, Perform wound care treatments as ordered.  Evaluation of Outcomes: Met   LCSW Treatment Plan for Primary Diagnosis: Overdose of opiate or related narcotic, accidental or unintentional, initial encounter Long Term Goal(s): Safe transition to appropriate next level of care at discharge, Engage patient in therapeutic group addressing interpersonal concerns.  Short Term Goals: Engage patient in aftercare planning with referrals and resources, Facilitate patient progression through stages of change regarding substance use diagnoses and concerns and Identify triggers associated with mental health/substance abuse issues  Therapeutic Interventions: Assess for all discharge needs, 1 to 1 time with Social worker, Explore available resources and support systems, Assess for adequacy in community support network, Educate family and significant other(s) on suicide prevention, Complete Psychosocial Assessment, Interpersonal group therapy.  Evaluation of Outcomes: Met   Progress in Treatment: Attending groups: Yes. Participating in groups: Yes. Taking medication as prescribed: Yes. Toleration medication: Yes. Family/Significant other contact made: SPE completed with pt; pt declined to consent to family contact.  Patient understands diagnosis: Yes. Discussing patient identified problems/goals with staff: Yes. Medical problems stabilized or resolved: Yes. Denies suicidal/homicidal ideation: Yes. Issues/concerns per patient self-inventory: No. Other: n/a  New problem(s) identified: Yes, Describe:  pt demonstrates poor insight into substance use  New Short Term/Long Term Goal(s): medication stabilization; detox; development of comprehensive mental  wellness/sobriety plan.   Discharge Plan or Barriers:  [facility] agreed to see patient again for assessment and for medication management/SAIOP. Pt plans to discharge by 11am in order to go to traffic court and will return home with his estranged wife.   Reason for Continuation of Hospitalization: none  Estimated Length of Stay: d/c today   Attendees: Patient: 08/12/2016 9:16 AM  Physician: Dr. Sharolyn Douglas MD 08/12/2016 9:16 AM  Nursing: Rica Mote RN 08/12/2016 9:16 AM  RN Care Manager: Lars Pinks CM 08/12/2016 9:16 AM  Social Worker: Maxie Better, LCSW; [place] Drinkard LCSW 08/12/2016 9:16 AM  Recreational Therapist:  08/12/2016 9:16 AM  Other: May Augustin NP 08/12/2016 9:16 AM  Other:  08/12/2016 9:16 AM  Other: 08/12/2016 9:16 AM    Scribe for Treatment Team: [place], LCSW 08/12/2016 9:16 AM

## 2016-12-10 ENCOUNTER — Other Ambulatory Visit: Payer: Self-pay | Admitting: Family Medicine

## 2016-12-10 NOTE — Telephone Encounter (Signed)
It looks like this was d/c after his last hospitalization  Please clarify with him and get a new med list for him Thanks

## 2016-12-10 NOTE — Telephone Encounter (Signed)
Not on med list, please advise 

## 2016-12-15 NOTE — Telephone Encounter (Signed)
Called home # on file and it was off, called alt # and it was work # pt is no longer @, Rx declined and pharmacy advise to have pt contact us

## 2017-05-17 ENCOUNTER — Emergency Department (HOSPITAL_COMMUNITY): Payer: Self-pay | Admitting: Certified Registered Nurse Anesthetist

## 2017-05-17 ENCOUNTER — Ambulatory Visit (HOSPITAL_COMMUNITY)
Admission: EM | Admit: 2017-05-17 | Discharge: 2017-05-17 | Disposition: A | Discharge status: Home / Self Care | Payer: Self-pay | Attending: Emergency Medicine | Admitting: Emergency Medicine

## 2017-05-17 ENCOUNTER — Encounter (HOSPITAL_COMMUNITY): Payer: Self-pay | Admitting: *Deleted

## 2017-05-17 ENCOUNTER — Emergency Department (HOSPITAL_COMMUNITY): Payer: Self-pay

## 2017-05-17 ENCOUNTER — Encounter (HOSPITAL_COMMUNITY)
Admission: EM | Disposition: A | Discharge status: Home / Self Care | Payer: Self-pay | Source: Home / Self Care | Attending: Emergency Medicine

## 2017-05-17 DIAGNOSIS — F909 Attention-deficit hyperactivity disorder, unspecified type: Secondary | ICD-10-CM | POA: Insufficient documentation

## 2017-05-17 DIAGNOSIS — Z7982 Long term (current) use of aspirin: Secondary | ICD-10-CM | POA: Insufficient documentation

## 2017-05-17 DIAGNOSIS — F17210 Nicotine dependence, cigarettes, uncomplicated: Secondary | ICD-10-CM | POA: Insufficient documentation

## 2017-05-17 DIAGNOSIS — L089 Local infection of the skin and subcutaneous tissue, unspecified: Secondary | ICD-10-CM | POA: Insufficient documentation

## 2017-05-17 DIAGNOSIS — G8929 Other chronic pain: Secondary | ICD-10-CM | POA: Insufficient documentation

## 2017-05-17 DIAGNOSIS — M659 Synovitis and tenosynovitis, unspecified: Secondary | ICD-10-CM

## 2017-05-17 HISTORY — PX: I&D EXTREMITY: SHX5045

## 2017-05-17 LAB — CBC WITH DIFFERENTIAL/PLATELET
BASOS ABS: 0 10*3/uL (ref 0.0–0.1)
Basophils Relative: 0 %
EOS PCT: 1 %
Eosinophils Absolute: 0.1 10*3/uL (ref 0.0–0.7)
HEMATOCRIT: 40.7 % (ref 39.0–52.0)
Hemoglobin: 14.5 g/dL (ref 13.0–17.0)
LYMPHS ABS: 1.7 10*3/uL (ref 0.7–4.0)
LYMPHS PCT: 22 %
MCH: 30.5 pg (ref 26.0–34.0)
MCHC: 35.6 g/dL (ref 30.0–36.0)
MCV: 85.7 fL (ref 78.0–100.0)
MONO ABS: 0.5 10*3/uL (ref 0.1–1.0)
MONOS PCT: 7 %
NEUTROS ABS: 5.3 10*3/uL (ref 1.7–7.7)
Neutrophils Relative %: 70 %
PLATELETS: 240 10*3/uL (ref 150–400)
RBC: 4.75 MIL/uL (ref 4.22–5.81)
RDW: 13 % (ref 11.5–15.5)
WBC: 7.5 10*3/uL (ref 4.0–10.5)

## 2017-05-17 LAB — BASIC METABOLIC PANEL
ANION GAP: 7 (ref 5–15)
BUN: 14 mg/dL (ref 6–20)
CHLORIDE: 102 mmol/L (ref 101–111)
CO2: 29 mmol/L (ref 22–32)
Calcium: 9.1 mg/dL (ref 8.9–10.3)
Creatinine, Ser: 0.83 mg/dL (ref 0.61–1.24)
GFR calc non Af Amer: >60 mL/min (ref >60)
Glucose, Bld: 115 mg/dL — ABNORMAL HIGH (ref 65–99)
POTASSIUM: 3.8 mmol/L (ref 3.5–5.1)
SODIUM: 138 mmol/L (ref 135–145)

## 2017-05-17 SURGERY — IRRIGATION AND DEBRIDEMENT EXTREMITY
Anesthesia: General | Laterality: Right

## 2017-05-17 MED ORDER — LIDOCAINE 2% (20 MG/ML) 5 ML SYRINGE
INTRAMUSCULAR | Status: AC
  Filled 2017-05-17: qty 5

## 2017-05-17 MED ORDER — CEFAZOLIN SODIUM-DEXTROSE 2-3 GM-% IV SOLR
INTRAVENOUS | Status: DC | PRN
  Administered 2017-05-17: 2 g via INTRAVENOUS

## 2017-05-17 MED ORDER — HYDROMORPHONE HCL 1 MG/ML IJ SOLN: 0.2500 mg | INTRAMUSCULAR | Status: DC | PRN

## 2017-05-17 MED ORDER — BUPIVACAINE HCL 0.25 % IJ SOLN
INTRAMUSCULAR | Status: DC | PRN
  Administered 2017-05-17: 6 mL

## 2017-05-17 MED ORDER — SODIUM CHLORIDE 0.9 % IJ SOLN
INTRAMUSCULAR | Status: AC
  Filled 2017-05-17: qty 10

## 2017-05-17 MED ORDER — FENTANYL CITRATE (PF) 250 MCG/5ML IJ SOLN
INTRAMUSCULAR | Status: AC
  Filled 2017-05-17: qty 5

## 2017-05-17 MED ORDER — ONDANSETRON HCL 4 MG/2ML IJ SOLN
INTRAMUSCULAR | Status: DC | PRN
  Administered 2017-05-17: 4 mg via INTRAVENOUS

## 2017-05-17 MED ORDER — MIDAZOLAM HCL 2 MG/2ML IJ SOLN
INTRAMUSCULAR | Status: DC | PRN
  Administered 2017-05-17: 2 mg via INTRAVENOUS

## 2017-05-17 MED ORDER — ONDANSETRON HCL 4 MG PO TABS: 4.0000 mg | ORAL_TABLET | Freq: Three times a day (TID) | ORAL | 0 refills | Status: DC | PRN

## 2017-05-17 MED ORDER — FENTANYL CITRATE (PF) 250 MCG/5ML IJ SOLN
INTRAMUSCULAR | Status: AC
Start: 2017-05-17 — End: ?
  Filled 2017-05-17: qty 5

## 2017-05-17 MED ORDER — CEPHALEXIN 500 MG PO CAPS: 500.0000 mg | ORAL_CAPSULE | Freq: Four times a day (QID) | ORAL | 0 refills | Status: AC

## 2017-05-17 MED ORDER — ONDANSETRON HCL 4 MG/2ML IJ SOLN
INTRAMUSCULAR | Status: AC
  Filled 2017-05-17: qty 2

## 2017-05-17 MED ORDER — MIDAZOLAM HCL 2 MG/2ML IJ SOLN
INTRAMUSCULAR | Status: AC
  Filled 2017-05-17: qty 2

## 2017-05-17 MED ORDER — PHENYLEPHRINE 40 MCG/ML (10ML) SYRINGE FOR IV PUSH (FOR BLOOD PRESSURE SUPPORT)
PREFILLED_SYRINGE | INTRAVENOUS | Status: AC
  Filled 2017-05-17: qty 10

## 2017-05-17 MED ORDER — ARTIFICIAL TEARS OPHTHALMIC OINT
TOPICAL_OINTMENT | OPHTHALMIC | Status: AC
  Filled 2017-05-17: qty 3.5

## 2017-05-17 MED ORDER — LACTATED RINGERS IV SOLN
INTRAVENOUS | Status: DC | PRN
  Administered 2017-05-17: 13:00:00 via INTRAVENOUS

## 2017-05-17 MED ORDER — NICOTINE 21 MG/24HR TD PT24
MEDICATED_PATCH | TRANSDERMAL | Status: AC
  Administered 2017-05-17: 21 mg via TRANSDERMAL
  Filled 2017-05-17: qty 1

## 2017-05-17 MED ORDER — PROPOFOL 10 MG/ML IV BOLUS
INTRAVENOUS | Status: AC
  Filled 2017-05-17: qty 40

## 2017-05-17 MED ORDER — PROPOFOL 10 MG/ML IV BOLUS
INTRAVENOUS | Status: DC | PRN
  Administered 2017-05-17: 200 mg via INTRAVENOUS

## 2017-05-17 MED ORDER — CEFAZOLIN SODIUM 1 G IJ SOLR
INTRAMUSCULAR | Status: AC
  Filled 2017-05-17: qty 20

## 2017-05-17 MED ORDER — LIDOCAINE 2% (20 MG/ML) 5 ML SYRINGE
INTRAMUSCULAR | Status: DC | PRN
  Administered 2017-05-17: 60 mg via INTRAVENOUS

## 2017-05-17 MED ORDER — NICOTINE 21 MG/24HR TD PT24
21.0000 mg | MEDICATED_PATCH | Freq: Once | TRANSDERMAL | Status: AC
  Administered 2017-05-17: 21 mg via TRANSDERMAL

## 2017-05-17 MED ORDER — SODIUM CHLORIDE 0.9 % IR SOLN
Status: DC | PRN
  Administered 2017-05-17: 1000 mL

## 2017-05-17 MED ORDER — KETOROLAC TROMETHAMINE 30 MG/ML IJ SOLN
30.0000 mg | Freq: Once | INTRAMUSCULAR | Status: AC
  Administered 2017-05-17: 30 mg via INTRAVENOUS
  Filled 2017-05-17: qty 1

## 2017-05-17 MED ORDER — MIDAZOLAM HCL 2 MG/2ML IJ SOLN
INTRAMUSCULAR | Status: AC
Start: 2017-05-17 — End: ?
  Filled 2017-05-17: qty 2

## 2017-05-17 MED ORDER — OXYCODONE-ACETAMINOPHEN 5-325 MG PO TABS: 1.0000 | ORAL_TABLET | Freq: Four times a day (QID) | ORAL | 0 refills | Status: DC | PRN

## 2017-05-17 SURGICAL SUPPLY — 54 items
BANDAGE ACE 4X5 VEL STRL LF (GAUZE/BANDAGES/DRESSINGS) ×3 IMPLANT
BANDAGE ACE 6X5 VEL STRL LF (GAUZE/BANDAGES/DRESSINGS) ×3 IMPLANT
BANDAGE ELASTIC 3 VELCRO ST LF (GAUZE/BANDAGES/DRESSINGS) ×3 IMPLANT
BANDAGE ESMARK 6X9 LF (GAUZE/BANDAGES/DRESSINGS) IMPLANT
BLADE SURG 10 STRL SS (BLADE) ×3 IMPLANT
BNDG COHESIVE 4X5 TAN STRL (GAUZE/BANDAGES/DRESSINGS) ×3 IMPLANT
BNDG ESMARK 4X9 LF (GAUZE/BANDAGES/DRESSINGS) IMPLANT
BNDG ESMARK 6X9 LF (GAUZE/BANDAGES/DRESSINGS)
BNDG GAUZE ELAST 4 BULKY (GAUZE/BANDAGES/DRESSINGS) ×3 IMPLANT
CONT SPEC 4OZ CLIKSEAL STRL BL (MISCELLANEOUS) IMPLANT
COVER SURGICAL LIGHT HANDLE (MISCELLANEOUS) ×3 IMPLANT
CUFF TOURN SGL LL 12 NO SLV (MISCELLANEOUS) IMPLANT
CUFF TOURNIQUET SINGLE 34IN LL (TOURNIQUET CUFF) IMPLANT
DRAPE SURG 17X23 STRL (DRAPES) IMPLANT
DRAPE U-SHAPE 47X51 STRL (DRAPES) IMPLANT
DRESSING ADAPTIC 1/2  N-ADH (PACKING) ×3 IMPLANT
DRSG PAD ABDOMINAL 8X10 ST (GAUZE/BANDAGES/DRESSINGS) ×3 IMPLANT
DURAPREP 26ML APPLICATOR (WOUND CARE) ×3 IMPLANT
ELECT REM PT RETURN 9FT ADLT (ELECTROSURGICAL)
ELECTRODE REM PT RTRN 9FT ADLT (ELECTROSURGICAL) IMPLANT
EVACUATOR 1/8 PVC DRAIN (DRAIN) IMPLANT
FACESHIELD WRAPAROUND (MASK) IMPLANT
GAUZE SPONGE 4X4 12PLY STRL (GAUZE/BANDAGES/DRESSINGS) ×3 IMPLANT
GAUZE SPONGE 4X4 16PLY XRAY LF (GAUZE/BANDAGES/DRESSINGS) ×3 IMPLANT
GAUZE XEROFORM 1X8 LF (GAUZE/BANDAGES/DRESSINGS) ×3 IMPLANT
GLOVE BIO SURGEON STRL SZ7.5 (GLOVE) ×6 IMPLANT
GLOVE BIOGEL PI IND STRL 8 (GLOVE) ×2 IMPLANT
GLOVE BIOGEL PI INDICATOR 8 (GLOVE) ×4
GOWN STRL REUS W/ TWL LRG LVL3 (GOWN DISPOSABLE) ×3 IMPLANT
GOWN STRL REUS W/TWL LRG LVL3 (GOWN DISPOSABLE) ×6
HANDPIECE INTERPULSE COAX TIP (DISPOSABLE)
KIT BASIN OR (CUSTOM PROCEDURE TRAY) ×3 IMPLANT
KIT ROOM TURNOVER OR (KITS) ×3 IMPLANT
MANIFOLD NEPTUNE II (INSTRUMENTS) ×3 IMPLANT
NEEDLE 25GAX1.5 (MISCELLANEOUS) IMPLANT
NS IRRIG 1000ML POUR BTL (IV SOLUTION) ×3 IMPLANT
PACK ORTHO EXTREMITY (CUSTOM PROCEDURE TRAY) ×3 IMPLANT
PAD ARMBOARD 7.5X6 YLW CONV (MISCELLANEOUS) ×6 IMPLANT
PADDING CAST SYNTHETIC 4 (CAST SUPPLIES) ×2
PADDING CAST SYNTHETIC 4X4 STR (CAST SUPPLIES) ×1 IMPLANT
SET HNDPC FAN SPRY TIP SCT (DISPOSABLE) IMPLANT
SPONGE LAP 18X18 X RAY DECT (DISPOSABLE) IMPLANT
STOCKINETTE IMPERVIOUS 9X36 MD (GAUZE/BANDAGES/DRESSINGS) ×3 IMPLANT
SUT ETHILON 3 0 PS 1 (SUTURE) IMPLANT
SUT PDS AB 2-0 CT1 27 (SUTURE) IMPLANT
SWAB CULTURE ESWAB REG 1ML (MISCELLANEOUS) IMPLANT
SYR CONTROL 10ML LL (SYRINGE) IMPLANT
TOWEL OR 17X24 6PK STRL BLUE (TOWEL DISPOSABLE) ×3 IMPLANT
TOWEL OR 17X26 10 PK STRL BLUE (TOWEL DISPOSABLE) ×3 IMPLANT
TUBE CONNECTING 12'X1/4 (SUCTIONS) ×1
TUBE CONNECTING 12X1/4 (SUCTIONS) ×2 IMPLANT
TUBING CYSTO DISP (UROLOGICAL SUPPLIES) IMPLANT
UNDERPAD 30X30 (UNDERPADS AND DIAPERS) ×3 IMPLANT
YANKAUER SUCT BULB TIP NO VENT (SUCTIONS) ×3 IMPLANT

## 2017-05-17 NOTE — Op Note (Signed)
05/17/2017  1:57 PM  PATIENT:  Jacob Newton    PRE-OPERATIVE DIAGNOSIS:  INFECTED RIGHT FINGER  POST-OPERATIVE DIAGNOSIS:  Same  PROCEDURE:  IRRIGATION AND DEBRIDEMENT EXTREMITY/RIGHT INDEX FINGER  SURGEON:  Mounir Skipper D, MD  ASSISTANT: Aquilla HackerHenry Martensen, PA-C, he was present and scrubbed throughout the case, critical for completion in a timely fashion, and for retraction, instrumentation, and closure.   ANESTHESIA:   gen  PREOPERATIVE INDICATIONS:  Jacob RuddyJohnathan S Newton is a  31 y.o. male with a diagnosis of INFECTED RIGHT FINGER who failed conservative measures and elected for surgical management.    The risks benefits and alternatives were discussed with the patient preoperatively including but not limited to the risks of infection, bleeding, nerve injury, cardiopulmonary complications, the need for revision surgery, among others, and the patient was willing to proceed.  OPERATIVE IMPLANTS: none  OPERATIVE FINDINGS: purulent fluid  BLOOD LOSS: min  COMPLICATIONS: none  TOURNIQUET TIME: 25min  OPERATIVE PROCEDURE:  Patient was identified in the preoperative holding area and site was marked by me He was transported to the operating theater and placed on the table in supine position taking care to pad all bony prominences. After a preincinduction time out anesthesia was induced. The Right upper extremity was prepped and draped in normal sterile fashion and a pre-incision timeout was performed. He received ancef for preoperative antibiotics.   I made an incision across his volar index finger including his traumatic wound from his home IND and carried this on in a Brunner fashion to the flexor crease and then diagonal across the nail phalanx. I immediately express a large amount of purulent fluid from his distal phalanx. I debrided necrotic tissue with scissors. Identified his flexor tendon sheath I did spread into this there was no pressure is purulence within the sheath. I  was satisfied that I had exposed expressed all purulent fluid.  I debrided around this tendon sheath to confirm no additional sites of purulence performing a T and a lysis here..  I then thoroughly irrigated his incision.  I closed his wound with nylon stitches. Sterile dressings applied he was awoken and taken the PACU in stable condition  POST OPERATIVE PLAN: Continue by mouth antibiotics follow-up in one week mobilize for DVT prophylaxis

## 2017-05-17 NOTE — Anesthesia Procedure Notes (Signed)
Procedure Name: LMA Insertion Date/Time: 05/17/2017 1:09 PM Performed by: Rise PatienceBELL, Jourden Delmont T Pre-anesthesia Checklist: Patient identified, Emergency Drugs available, Suction available and Patient being monitored Patient Re-evaluated:Patient Re-evaluated prior to induction Oxygen Delivery Method: Circle System Utilized Preoxygenation: Pre-oxygenation with 100% oxygen Induction Type: IV induction LMA: LMA inserted LMA Size: 5.0 Number of attempts: 1 Airway Equipment and Method: Bite block Placement Confirmation: positive ETCO2 Tube secured with: Tape Dental Injury: Teeth and Oropharynx as per pre-operative assessment

## 2017-05-17 NOTE — Anesthesia Postprocedure Evaluation (Signed)
Anesthesia Post Note  Patient: Jacob Newton  Procedure(s) Performed: Procedure(s) (LRB): IRRIGATION AND DEBRIDEMENT EXTREMITY/RIGHT INDEX FINGER (Right)     Patient location during evaluation: PACU Anesthesia Type: General Level of consciousness: awake and alert Pain management: pain level controlled Vital Signs Assessment: post-procedure vital signs reviewed and stable Respiratory status: spontaneous breathing, nonlabored ventilation and respiratory function stable Cardiovascular status: blood pressure returned to baseline and stable Postop Assessment: no signs of nausea or vomiting Anesthetic complications: no    Last Vitals:  Vitals:   05/17/17 1442 05/17/17 1448  BP: (!) 132/92   Pulse: 74 71  Resp: 11 15  Temp:    SpO2: 100% 99%    Last Pain:  Vitals:   05/17/17 0918  TempSrc:   PainSc: 6                  Tmya Wigington,W. EDMOND

## 2017-05-17 NOTE — ED Provider Notes (Signed)
[facility]-EMERGENCY DEPT Provider Note   CSN: [id number] Arrival date & time: 05/17/17  0846   History   Chief Complaint Chief Complaint  Patient presents with  . Hand Pain    HPI Jacob Newton is a 31 y.o. male.  Patient has no significant PMH. He states that 4 days ago he noticed swelling of right index finger. It started at the tip of his finger but spread down to the rest of his finger over the past few days. The patient reports increased joint stiffness and significant pain with palpation of the index finger. The patient reports that the pain has progressively gotten worse and he has started to experience sharp pains in his R wrist and forearm while sleeping. He can no longer move any of the joints in his finger. He is able to move his right wrist.  He denies any significant trauma to the area. He works on [employer]. He initially thought that something was stuck in his finger 4 days ago, because he noticed a small dark black spot in the center of the swollen area at the tip of his right index finger. Because of his concern for something being stuck in his finger, he used a clean razor blade and needle to try to remove the dark black spot about 2 days ago. He didn't think anything came out of the wound when he did this. The swelling and pain continued to progress until it was so bad that he sought care.  He endorses smoking cigarrettes and marijuana daily. Denies IV drug use (quit heroin 1 year ago) and denied opoid medication abuse.      Past Medical History:  Diagnosis Date  . ADHD (attention deficit hyperactivity disorder)   . Back pain    chronic low back pain, with L%-S1 stenosis/bulge sisc/arthropathy  . Closed lumbar fx w/ cord inj 2010   hx lumbar fx  . Opiate abuse, continuous   . 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 Viral meningitis 7/15    Patient Active Problem List   Diagnosis Date Noted  . Overdose of opiate or related  narcotic, accidental or unintentional, initial encounter 08/11/2016  . Major depressive disorder, severe (HCC) 08/10/2016  . Migraine syndrome 06/20/2016  . 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 Adjustment reaction 01/14/2016  . Screening for lipoid disorders 05/21/2015  . Adverse effects of medication 09/05/2013  . ADD (attention deficit disorder) 06/28/2012  . Poor concentration 02/23/2012  . Routine general medical examination at a health care facility 02/12/2012  . Smoker 08/22/2011  . Chronic back pain 02/07/2010    History reviewed. No pertinent surgical history.     Home Medications    Prior to Admission medications   Medication Sig Start Date End Date Taking? Authorizing Provider  Aspirin-Salicylamide-Caffeine (BC HEADACHE POWDER PO) Take 1 Package by mouth 2 (two) times daily as needed (pain/headache).    Yes [provider]    Family History Family History  Problem Relation Age of Onset  . Hypertension Mother   . Diabetes Mother     Social History Social History  Substance Use Topics  . Smoking status: Current Every Day Smoker    Packs/day: 1.00    Types: E-cigarettes  . Smokeless tobacco: Never Used     Comment: vape   . Alcohol use No     Comment: rarely on holidays    Allergies   Chantix [varenicline tartrate]  Review of Systems Review  of Systems  Constitutional: Negative for diaphoresis, fatigue and fever.  HENT: Negative for sinus pain and sinus pressure.   Respiratory: Negative for cough and shortness of breath.   Cardiovascular: Negative for chest pain and leg swelling.  Gastrointestinal: Negative for constipation, diarrhea, nausea and vomiting.  Genitourinary: Negative for difficulty urinating.  Musculoskeletal: Positive for joint swelling (diffusely in R index finger). Negative for arthralgias and myalgias.  Skin: Positive for wound (distal R index finger). Negative for rash.  Neurological: Negative for dizziness, light-headedness  and headaches.   Physical Exam Updated Vital Signs BP 123/86 (BP Location: Right Arm)   Pulse 76   Temp 97.9 F (36.6 C) (Oral)   Resp 16   SpO2 98%   Physical Exam  Constitutional: He appears well-developed and well-nourished. No distress.  Cardiovascular: Normal rate, regular rhythm and intact distal pulses.  Exam reveals no friction rub.   No murmur heard. Pulmonary/Chest: Effort normal. No respiratory distress. He has no wheezes.  Abdominal: Soft. He exhibits no distension and no mass. There is no tenderness. There is no guarding.  Musculoskeletal: Edema: of R index.  The patient's R index finger is diffusely swollen from distal to proximal phalanx joint when compared to his L index finger. There is tenderness with palpation over all joints of R index finger. No overlying erythema noted.  Neurological:  Pain limited strength testing of R hand. 5/5 grip strength of L hand.   Skin: Skin is warm and dry. Capillary refill takes less than 2 seconds. No rash noted. No erythema.    ED Treatments / Results  Labs (all labs ordered are listed, but only abnormal results are displayed) Labs Reviewed  CBC WITH DIFFERENTIAL/PLATELET  BASIC METABOLIC PANEL   EKG  EKG Interpretation None      Radiology Dg Hand Complete Right  Result Date: 05/17/2017 CLINICAL DATA:  31 year old male with pain and swelling of the right index finger. Evaluate for foreign body. EXAM: RIGHT HAND - COMPLETE 3+ VIEW COMPARISON:  None. FINDINGS: Mild soft tissue swelling of the pad of the index finger of the right hand. No evidence of underlying fracture or radiopaque foreign body. Soft tissue nodule present along the middle phalanx of the small finger. Small well corticated ossific body at the ulnar styloid likely represents nonunion of remote ulnar styloid fracture. The remainder the visualized bones and joints are unremarkable. IMPRESSION: 1. Soft tissue swelling of along the pad of the distal phalanx of the  index finger without evidence of underlying foreign body. 2. Soft tissue nodule arising at the level of the middle phalanx of the small finger. 3. 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 Electronically Signed   By: Malachy Moan M.D.   On: 05/17/2017 10:04   Procedures Procedures (including critical care time)  Medications Ordered in ED Medications  ketorolac (TORADOL) 30 MG/ML injection 30 mg (30 mg Intravenous Given 05/17/17 1108)    Initial Impression / Assessment and Plan / ED Course  I have reviewed the triage vital signs and the nursing notes.  Pertinent labs & imaging results that were available during my care of the patient were reviewed by me and considered in my medical decision making (see chart for details).  The patient's initial imaging does not show any foreign bodies, but shows diffuse swelling of R index finger consistent with his physical exam. The patient's exam is consistent with flexor tenosynovitis. The patient's description of his pain that radiates into his wrist/forearm is also consistent with  this diagnosis. His currently hemodynamically stable, afebrile, and his PE does not show any signs of infection. Will not start antibiotics at this time. Dr. Alric RanGoldston's consult with hand surgeon suggested that the [facility] needs [facility]. Patient has not eaten today and is amenable to plan.   Final Clinical Impressions(s) / ED Diagnoses   Final diagnoses:  Tenosynovitis of finger   [facility]  New Prescriptions New Prescriptions   No medications on file     Rozann LeschesNedrud, Selenia Mihok, MD 05/17/17 1126    Pricilla LovelessGoldston, Scott, MD 05/19/17 0900

## 2017-05-17 NOTE — Transfer of Care (Signed)
Immediate Anesthesia Transfer of Care Note  Patient: Peace S Huberty  Procedure(s) Performed: Procedure(s): IRRIGATION AND DEBRIDEMENT EXTREMITY/RIGHT INDEX FINGER (Right)  Patient Location: PACU  Anesthesia Type:General  Level of Consciousness: awake, alert  and oriented  Airway & Oxygen Therapy: Patient Spontanous Breathing  Post-op Assessment: Report given to RN, Post -op Vital signs reviewed and stable and Patient moving all extremities X 4  Post vital signs: Reviewed and stable  Last Vitals:  Vitals:   05/17/17 0910 05/17/17 1148  BP: 123/86 124/80  Pulse: 76 65  Resp: 16 16  Temp: 36.6 C   SpO2: 98% 100%    Last Pain:  Vitals:   05/17/17 0918  TempSrc:   PainSc: 6          Complications: No apparent anesthesia complications

## 2017-05-17 NOTE — Anesthesia Preprocedure Evaluation (Addendum)
Anesthesia Evaluation  Patient identified by MRN, date of birth, ID band Patient awake    Reviewed: Allergy & Precautions, H&P , NPO status , Patient's Chart, lab work & pertinent test results  Airway Mallampati: II  TM Distance: >3 FB Neck ROM: Full    Dental no notable dental hx. (+) Teeth Intact, Dental Advisory Given   Pulmonary Current Smoker,    Pulmonary exam normal breath sounds clear to auscultation       Cardiovascular negative cardio ROS   Rhythm:Regular Rate:Normal     Neuro/Psych  Headaches, Depression negative psych ROS   GI/Hepatic negative GI ROS, (+)     substance abuse  marijuana use,   Endo/Other  negative endocrine ROS  Renal/GU negative Renal ROS  negative genitourinary   Musculoskeletal   Abdominal   Peds  (+) ADHD Hematology negative hematology ROS (+)   Anesthesia Other Findings   Reproductive/Obstetrics negative OB ROS                            Anesthesia Physical Anesthesia Plan  ASA: II  Anesthesia Plan: General   Post-op Pain Management:    Induction: Intravenous  PONV Risk Score and Plan: 2 and Ondansetron, Dexamethasone and Midazolam  Airway Management Planned: LMA  Additional Equipment:   Intra-op Plan:   Post-operative Plan: Extubation in OR  Informed Consent: I have reviewed the patients History and Physical, chart, labs and discussed the procedure including the risks, benefits and alternatives for the proposed anesthesia with the patient or authorized representative who has indicated his/her understanding and acceptance.   Dental advisory given  Plan Discussed with: CRNA  Anesthesia Plan Comments:        Anesthesia Quick Evaluation

## 2017-05-17 NOTE — H&P (Signed)
ORTHOPAEDIC CONSULTATION  REQUESTING PHYSICIAN: Sherwood Gambler, MD  Chief Complaint: R index finger infection  HPI: Jacob Newton is a 31 y.o. male who complains of a penetrating injury to the right index finger volarly about a week ago. He noticed swelling on Tuesday he tried to length this area he did not see any improvement. He's had persistent pain he denies fevers he denies numbness or tingling.  Past Medical History:  Diagnosis Date  . ADHD (attention deficit hyperactivity disorder)   . Back pain    chronic low back pain, with L%-S1 stenosis/bulge sisc/arthropathy  . Closed lumbar fx w/ cord inj 2010   hx lumbar fx  . Opiate abuse, continuous   . 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 Viral meningitis 7/15   History reviewed. No pertinent surgical history. Social History   Social History  . Marital status: Married    Spouse name: N/A  . Number of children: N/A  . Years of education: N/A   Social History Main Topics  . Smoking status: Current Every Day Smoker    Packs/day: 1.00    Types: E-cigarettes  . Smokeless tobacco: Never Used     Comment: vape   . Alcohol use No     Comment: rarely on holidays  . Drug use: Yes    Types: IV, Heroin, Cocaine     Comment: opiates, heroin  . Sexual activity: Yes    Birth control/ protection: None   Other Topics Concern  . None   Social History Narrative  . None   Family History  Problem Relation Age of Onset  . Hypertension Mother   . Diabetes Mother    Allergies  Allergen Reactions  . Chantix [Varenicline Tartrate] Other (See Comments)    Bad dreams   Prior to Admission medications   Medication Sig Start Date End Date Taking? Authorizing Provider  Aspirin-Salicylamide-Caffeine (BC HEADACHE POWDER PO) Take 1 Package by mouth 2 (two) times daily as needed (pain/headache).    Yes [provider]   Dg Hand Complete Right  Result Date: 05/17/2017 CLINICAL DATA:   31 year old male with pain and swelling of the right index finger. Evaluate for foreign body. EXAM: RIGHT HAND - COMPLETE 3+ VIEW COMPARISON:  None. FINDINGS: Mild soft tissue swelling of the pad of the index finger of the right hand. No evidence of underlying fracture or radiopaque foreign body. Soft tissue nodule present along the middle phalanx of the small finger. Small well corticated ossific body at the ulnar styloid likely represents nonunion of remote ulnar styloid fracture. The remainder the visualized bones and joints are unremarkable. IMPRESSION: 1. Soft tissue swelling of along the pad of the distal phalanx of the index finger without evidence of underlying foreign body. 2. Soft tissue nodule arising at the level of the middle phalanx of the small finger. 3. 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 Electronically Signed   By: Jacqulynn Cadet M.D.   On: 05/17/2017 10:04    Positive ROS: All other systems have been reviewed and were otherwise negative with the exception of those mentioned in the HPI and as above.  Labs cbc  Recent Labs  05/17/17 1134  WBC 7.5  HGB 14.5  HCT 40.7  PLT 240    Labs inflam No results for input(s): CRP in the last 72 hours.  Invalid input(s): ESR  Labs coag No results for input(s): INR, PTT in the last 72 hours.  Invalid input(s): PT   Recent Labs  05/17/17 1134  NA 138  K 3.8  CL 102  CO2 29  GLUCOSE 115*  BUN 14  CREATININE 0.83  CALCIUM 9.1    Physical Exam: Vitals:   05/17/17 0910 05/17/17 1148  BP: 123/86 124/80  Pulse: 76 65  Resp: 16 16  Temp: 97.9 F (36.6 C)   SpO2: 98% 100%   General: Alert, no acute distress Cardiovascular: No pedal edema Respiratory: No cyanosis, no use of accessory musculature GI: No organomegaly, abdomen is soft and non-tender Skin: No lesions in the area of chief complaint other than those listed below in MSK exam.  Neurologic: Sensation intact distally save for the below  mentioned MSK exam Psychiatric: Patient is competent for consent with normal mood and affect Lymphatic: No axillary or cervical lymphadenopathy  MUSCULOSKELETAL:  The right upper extremity index finger he has swelling across the volar aspect of the distal phalanx and a little bit into the proximal or the middle phalanx rather he does have slightly diminished but intact sensation distally he has limited range of motion due to swelling but does have intact tendon function with extension and flexion. No tenderness or erythema proximal to the PIP joint. Other extremities are atraumatic with painless ROM and NVI.  Assessment: Right index finger infection  Plan: OR today for debridement and exploration. We'll hold antibiotics until culture.   Renette Butters, MD Cell [phone]   05/17/2017 1:05 PM

## 2017-05-17 NOTE — ED Triage Notes (Signed)
Pt complains of swelling to right index finger for the past 5 days. Pt states swelling began in the tip of his finger and has spread down through his hand. Pt noticed a possible foreign object in the tip of his finger. 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

## 2017-05-17 NOTE — ED Triage Notes (Signed)
Pt called,no answer.

## 2017-05-17 NOTE — Discharge Instructions (Signed)
Diet: As you were doing prior to hospitalization   Shower:  May shower but keep the wounds dry, use an occlusive plastic wrap, NO SOAKING IN TUB.  If the bandage gets wet, change with a clean dry gauze.    Dressing:  Keep dressings on and dry until follow up.  Activity:  Increase activity slowly as tolerated, but follow the weight bearing instructions below.  The rules on driving is that you can not be taking narcotics while you drive, and you must feel in control of the vehicle.    Weight Bearing:   Non weight bearing right hand  To prevent constipation: you may use a stool softener such as -  Colace (over the counter) 100 mg by mouth twice a day  Drink plenty of fluids (prune juice may be helpful) and high fiber foods Miralax (over the counter) for constipation as needed.    Itching:  If you experience itching with your medications, try taking only a single pain pill, or even half a pain pill at a time.  You can also use benadryl over the counter for itching or also to help with sleep.   Precautions:  If you experience chest pain or shortness of breath - call 911 immediately for transfer to the hospital emergency department!!  If you develop a fever greater that 101 F, purulent drainage from wound, increased redness or drainage from wound, or calf pain -- Call the office at [phone]                                                 Follow- Up Appointment:  Please call for an appointment to be seen in 2 weeks [place] - [phone]

## 2017-05-18 ENCOUNTER — Encounter (HOSPITAL_COMMUNITY): Payer: Self-pay | Admitting: Orthopedic Surgery

## 2017-05-23 LAB — AEROBIC/ANAEROBIC CULTURE W GRAM STAIN (SURGICAL/DEEP WOUND): Culture: NO GROWTH

## 2017-05-23 LAB — AEROBIC/ANAEROBIC CULTURE (SURGICAL/DEEP WOUND)

## 2017-06-11 ENCOUNTER — Encounter (HOSPITAL_COMMUNITY): Payer: Self-pay

## 2017-06-11 ENCOUNTER — Emergency Department (HOSPITAL_COMMUNITY): Payer: Self-pay

## 2017-06-11 ENCOUNTER — Emergency Department (HOSPITAL_COMMUNITY)
Admission: EM | Admit: 2017-06-11 | Discharge: 2017-06-11 | Disposition: A | Discharge status: Home / Self Care | Payer: Self-pay | Attending: Emergency Medicine | Admitting: Emergency Medicine

## 2017-06-11 DIAGNOSIS — L03113 Cellulitis of right upper limb: Secondary | ICD-10-CM | POA: Insufficient documentation

## 2017-06-11 DIAGNOSIS — F17290 Nicotine dependence, other tobacco product, uncomplicated: Secondary | ICD-10-CM | POA: Insufficient documentation

## 2017-06-11 LAB — CBC WITH DIFFERENTIAL/PLATELET
Basophils Absolute: 0 10*3/uL (ref 0.0–0.1)
Basophils Relative: 0 %
EOS ABS: 0.1 10*3/uL (ref 0.0–0.7)
Eosinophils Relative: 1 %
HEMATOCRIT: 42.4 % (ref 39.0–52.0)
HEMOGLOBIN: 15.2 g/dL (ref 13.0–17.0)
LYMPHS ABS: 1.8 10*3/uL (ref 0.7–4.0)
LYMPHS PCT: 25 %
MCH: 30.2 pg (ref 26.0–34.0)
MCHC: 35.8 g/dL (ref 30.0–36.0)
MCV: 84.3 fL (ref 78.0–100.0)
MONOS PCT: 9 %
Monocytes Absolute: 0.6 10*3/uL (ref 0.1–1.0)
NEUTROS PCT: 65 %
Neutro Abs: 4.6 10*3/uL (ref 1.7–7.7)
Platelets: 237 10*3/uL (ref 150–400)
RBC: 5.03 MIL/uL (ref 4.22–5.81)
RDW: 12.8 % (ref 11.5–15.5)
WBC: 7 10*3/uL (ref 4.0–10.5)

## 2017-06-11 LAB — BASIC METABOLIC PANEL
ANION GAP: 11 (ref 5–15)
BUN: 14 mg/dL (ref 6–20)
CHLORIDE: 99 mmol/L — AB (ref 101–111)
CO2: 28 mmol/L (ref 22–32)
Calcium: 9.3 mg/dL (ref 8.9–10.3)
Creatinine, Ser: 1 mg/dL (ref 0.61–1.24)
GFR calc non Af Amer: >60 mL/min (ref >60)
GLUCOSE: 104 mg/dL — AB (ref 65–99)
Potassium: 3.8 mmol/L (ref 3.5–5.1)
Sodium: 138 mmol/L (ref 135–145)

## 2017-06-11 MED ORDER — CLINDAMYCIN HCL 150 MG PO CAPS: 300.0000 mg | ORAL_CAPSULE | Freq: Three times a day (TID) | ORAL | 0 refills | Status: DC

## 2017-06-11 MED ORDER — IBUPROFEN 600 MG PO TABS: 600.0000 mg | ORAL_TABLET | Freq: Four times a day (QID) | ORAL | 0 refills | Status: DC | PRN

## 2017-06-11 MED ORDER — CLINDAMYCIN PHOSPHATE 600 MG/50ML IV SOLN
600.0000 mg | Freq: Once | INTRAVENOUS | Status: AC
  Administered 2017-06-11: 600 mg via INTRAVENOUS
  Filled 2017-06-11: qty 50

## 2017-06-11 NOTE — ED Triage Notes (Signed)
Pt had surgery on an infected finger about two weeks ago, now the elbow on that same arm is red, warm and swollen

## 2017-06-11 NOTE — ED Provider Notes (Signed)
[facility] DEPT Provider Note   CSN: [id number] Arrival date & time: 06/11/17  0249     History   Chief Complaint Chief Complaint  Patient presents with  . Joint Swelling    HPI Jacob Newton is a 31 y.o. male.  HPI Jacob Newton is a 31 y.o. male presents to ED with complaint of right elbow swelling. States swelling started 3 days ago. Reports elbow is red, warm, painful to touch and move. No fever or chills. Recent right index finger infection on the same extremity which was I&Ded in OR. No treatment prior to coming in. Denies any injuries, however has small abrasion to the elbow that he is not sure how he thought. Denies any IV drug use.   Past Medical History:  Diagnosis Date  . ADHD (attention deficit hyperactivity disorder)   . Back pain    chronic low back pain, with L%-S1 stenosis/bulge sisc/arthropathy  . Closed lumbar fx w/ cord inj 2010   hx lumbar fx  . Opiate abuse, continuous ([place])   . 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 Viral meningitis 7/15    Patient Active Problem List   Diagnosis Date Noted  . Overdose of opiate or related narcotic, accidental or unintentional, initial encounter ([place]) 08/11/2016  . Major depressive disorder, severe ([place]) 08/10/2016  . Migraine syndrome 06/20/2016  . Addiction to drug ([place]) 01/14/2016  . Adjustment reaction 01/14/2016  . Screening for lipoid disorders 05/21/2015  . Adverse effects of medication 09/05/2013  . ADD (attention deficit disorder) 06/28/2012  . Poor concentration 02/23/2012  . Routine general medical examination at a health care facility 02/12/2012  . Smoker 08/22/2011  . Chronic back pain 02/07/2010    Past Surgical History:  Procedure Laterality Date  . I&D EXTREMITY Right 05/17/2017   Procedure: IRRIGATION AND DEBRIDEMENT EXTREMITY/RIGHT INDEX FINGER;  Surgeon: Renette Butters, MD;  Location: [place];  Service: Orthopedics;  Laterality: Right;        Home Medications    Prior to Admission medications   Not on File    Family History Family History  Problem Relation Age of Onset  . Hypertension Mother   . Diabetes Mother     Social History Social History  Substance Use Topics  . Smoking status: Current Every Day Smoker    Packs/day: 1.00    Types: E-cigarettes  . Smokeless tobacco: Never Used     Comment: vape   . Alcohol use No     Comment: rarely on holidays     Allergies   Chantix [varenicline tartrate]   Review of Systems Review of Systems  Constitutional: Negative for chills and fever.  Musculoskeletal: Positive for arthralgias and joint swelling.  Neurological: Negative for weakness and numbness.  All other systems reviewed and are negative.    Physical Exam Updated Vital Signs BP 135/85 (BP Location: Right Arm)   Pulse (!) 120   Temp 98 F (36.7 C) (Oral)   Resp 18   SpO2 96%   Physical Exam  Constitutional: He is oriented to person, place, and time. He appears well-developed and well-nourished. No distress.  Eyes: Conjunctivae are normal.  Neck: Neck supple.  Cardiovascular: Normal rate.   Pulmonary/Chest: No respiratory distress.  Abdominal: He exhibits no distension.  Musculoskeletal:  Swelling noted to the posterior right elbow. Full range of motion of the joint, however painful. Tenderness over olecranon with erythema, warmth to touch. No palpable fluctuance. Erythema extends slightly down  the forearm. Distal radial pulses intact. Full range of motion at the wrist.  Neurological: He is alert and oriented to person, place, and time.  Skin: Skin is warm and dry.  Small, superficial abrasion to the elbow posteriorly with surrounding erythema and swelling as described above  Nursing note and vitals reviewed.    ED Treatments / Results  Labs (all labs ordered are listed, but only abnormal results are displayed) Labs Reviewed  BASIC METABOLIC PANEL - Abnormal; Notable for the  following:       Result Value   Chloride 99 (*)    Glucose, Bld 104 (*)    All other components within normal limits  CBC WITH DIFFERENTIAL/PLATELET    EKG  EKG Interpretation None       Radiology Dg Elbow Complete Right  Result Date: 06/11/2017 CLINICAL DATA:  Pt had surgery on an infected rt finger about two weeks ago, now the rt elbow is red, warm and swollen no known trauma EXAM: RIGHT ELBOW - COMPLETE 3+ VIEW COMPARISON:  None. FINDINGS: There is no evidence of fracture, dislocation, or joint effusion. There is no evidence of arthropathy or other focal bone abnormality. Soft tissues are unremarkable. IMPRESSION: Negative. Electronically Signed   By: Lucienne Capers M.D.   On: 06/11/2017 06:43    Procedures Procedures (including critical care time)  Medications Ordered in ED Medications  clindamycin (CLEOCIN) IVPB 600 mg (not administered)     Initial Impression / Assessment and Plan / ED Course  I have reviewed the triage vital signs and the nursing notes.  Pertinent labs & imaging results that were available during my care of the patient were reviewed by me and considered in my medical decision making (see chart for details).     Patient is here with swelling, redness, tenderness over right posterior elbow. No anterior joint tenderness. Full range of motion of the joint, doubt septic joint. He is afebrile, nontoxic-appearing otherwise. X-ray and labs ordered. Will give antibiotics.   X-rays negative, normal CBC and be met, no elevation in white blood cell count. Suspect olecranon bursitis versus cellulitis of the elbow. Again highly doubt septic joint given full range of motion of the joint. Patient received clindamycin in the emergency department. We'll discharge home with antibiotics, ice, elevation, follow up in 3 days for recheck. Return precautions discussed.  Vitals:   06/11/17 0259 06/11/17 0650  BP: 135/85 105/73  Pulse: (!) 120 80  Resp: 18 14  Temp: 98  F (36.7 C) 98 F (36.7 C)  TempSrc: Oral Oral  SpO2: 96% 100%     Final Clinical Impressions(s) / ED Diagnoses   Final diagnoses:  Cellulitis of right elbow    New Prescriptions New Prescriptions   CLINDAMYCIN (CLEOCIN) 150 MG CAPSULE    Take 2 capsules (300 mg total) by mouth 3 (three) times daily.   IBUPROFEN (ADVIL,MOTRIN) 600 MG TABLET    Take 1 tablet (600 mg total) by mouth every 6 (six) hours as needed.     Jeannett Senior, PA-C 06/11/17 1031    Varney Biles, MD 06/12/17 [phone]

## 2017-06-11 NOTE — Discharge Instructions (Signed)
Ice and elevate your arm. Take ibuprofen for pain. Take clindamycin for infection until all gone. Follow up in 3 days for recheck. Return if worsening.

## 2017-07-02 ENCOUNTER — Emergency Department (HOSPITAL_COMMUNITY)
Admission: EM | Admit: 2017-07-02 | Discharge: 2017-07-02 | Disposition: A | Discharge status: Home / Self Care | Payer: Self-pay | Attending: Emergency Medicine | Admitting: Emergency Medicine

## 2017-07-02 ENCOUNTER — Emergency Department (HOSPITAL_COMMUNITY): Payer: Self-pay

## 2017-07-02 ENCOUNTER — Encounter (HOSPITAL_COMMUNITY): Payer: Self-pay

## 2017-07-02 DIAGNOSIS — F17290 Nicotine dependence, other tobacco product, uncomplicated: Secondary | ICD-10-CM | POA: Insufficient documentation

## 2017-07-02 DIAGNOSIS — L02511 Cutaneous abscess of right hand: Secondary | ICD-10-CM | POA: Insufficient documentation

## 2017-07-02 LAB — BASIC METABOLIC PANEL
ANION GAP: 9 (ref 5–15)
BUN: 12 mg/dL (ref 6–20)
CALCIUM: 9 mg/dL (ref 8.9–10.3)
CO2: 27 mmol/L (ref 22–32)
CREATININE: 0.97 mg/dL (ref 0.61–1.24)
Chloride: 105 mmol/L (ref 101–111)
GFR calc Af Amer: >60 mL/min (ref >60)
GLUCOSE: 85 mg/dL (ref 65–99)
Potassium: 3.7 mmol/L (ref 3.5–5.1)
Sodium: 141 mmol/L (ref 135–145)

## 2017-07-02 LAB — CBC
HCT: 42.2 % (ref 39.0–52.0)
HEMOGLOBIN: 14.8 g/dL (ref 13.0–17.0)
MCH: 30.1 pg (ref 26.0–34.0)
MCHC: 35.1 g/dL (ref 30.0–36.0)
MCV: 85.9 fL (ref 78.0–100.0)
PLATELETS: 268 10*3/uL (ref 150–400)
RBC: 4.91 MIL/uL (ref 4.22–5.81)
RDW: 12.5 % (ref 11.5–15.5)
WBC: 8.2 10*3/uL (ref 4.0–10.5)

## 2017-07-02 MED ORDER — MORPHINE SULFATE (PF) 4 MG/ML IV SOLN
4.0000 mg | Freq: Once | INTRAVENOUS | Status: AC
  Administered 2017-07-02: 4 mg via INTRAVENOUS
  Filled 2017-07-02: qty 1

## 2017-07-02 MED ORDER — DOXYCYCLINE HYCLATE 100 MG PO CAPS: 100.0000 mg | ORAL_CAPSULE | Freq: Two times a day (BID) | ORAL | 0 refills | Status: DC

## 2017-07-02 MED ORDER — DOXYCYCLINE HYCLATE 100 MG PO TABS
100.0000 mg | ORAL_TABLET | Freq: Once | ORAL | Status: AC
  Administered 2017-07-02: 100 mg via ORAL
  Filled 2017-07-02: qty 1

## 2017-07-02 MED ORDER — LIDOCAINE HCL 2 % IJ SOLN: 20.0000 mL | Freq: Once | INTRAMUSCULAR | Status: DC

## 2017-07-02 MED ORDER — LIDOCAINE HCL (PF) 2 % IJ SOLN
20.0000 mL | Freq: Once | INTRAMUSCULAR | Status: AC
  Administered 2017-07-02: 20 mL
  Filled 2017-07-02: qty 20

## 2017-07-02 NOTE — ED Notes (Signed)
Bed: WTR7 Expected date:  Expected time:  Means of arrival:  Comments: 

## 2017-07-02 NOTE — ED Notes (Signed)
ED Provider at bedside. 

## 2017-07-02 NOTE — ED Notes (Signed)
Bed: WTR9 Expected date:  Expected time:  Means of arrival:  Comments: 

## 2017-07-02 NOTE — ED Notes (Signed)
Pt has hx of right index finger infection with surgery neeed, pt states same s/s to middle finger.

## 2017-07-02 NOTE — ED Provider Notes (Signed)
[facility]-EMERGENCY DEPT Provider Note   CSN: [id number] Arrival date & time: 07/02/17  1004     History   Chief Complaint Chief Complaint  Patient presents with  . Hand Pain    R Middle    HPI Jacob Newton is a 31 y.o. male.  HPI Patient presents with 2 days of increasing pain of the distal phalanx of his right middle finger.  He tried to express pus from the distal and last night himself without success.  He denies fevers and chills.  He is still able to flex his right middle finger although with some pain.  No spreading redness.  No pain in his middle or proximal phalanx.  He recently had surgery on his right index finger and underwent incision and drainage of a distal phalanx abscess which was not involving the flexor sheath.  He has healed normally from the surgery.  He denies IV drug abuse.   Past Medical History:  Diagnosis Date  . ADHD (attention deficit hyperactivity disorder)   . Back pain    chronic low back pain, with L%-S1 stenosis/bulge sisc/arthropathy  . Closed lumbar fx w/ cord inj 2010   hx lumbar fx  . Opiate abuse, continuous ([facility])   . 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 Viral meningitis 7/15    Patient Active Problem List   Diagnosis Date Noted  . Overdose of opiate or related narcotic, accidental or unintentional, initial encounter ([facility]) 08/11/2016  . Major depressive disorder, severe ([facility]) 08/10/2016  . Migraine syndrome 06/20/2016  . 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 Adjustment reaction 01/14/2016  . Screening for lipoid disorders 05/21/2015  . Adverse effects of medication 09/05/2013  . ADD (attention deficit disorder) 06/28/2012  . Poor concentration 02/23/2012  . Routine general medical examination at a health care facility 02/12/2012  . Smoker 08/22/2011  . Chronic back pain 02/07/2010    Past Surgical History:  Procedure Laterality Date  . I&D EXTREMITY Right 05/17/2017   Procedure: IRRIGATION AND DEBRIDEMENT EXTREMITY/RIGHT INDEX FINGER;  Surgeon: Sheral ApleyMurphy, Timothy D, MD;  Location: [facility] OR;  Service: Orthopedics;  Laterality: Right;       Home Medications    Prior to Admission medications   Medication Sig Start Date End Date Taking? Authorizing Provider  Aspirin-Salicylamide-Caffeine (BC HEADACHE POWDER PO) Take 1 packet by mouth 2 (two) times daily as needed (mild pain/headache).   Yes [provider]  doxycycline (VIBRAMYCIN) 100 MG capsule Take 1 capsule (100 mg total) by mouth 2 (two) times daily. 07/02/17   Azalia Bilisampos, Jamera Vanloan, MD    Family History Family History  Problem Relation Age of Onset  . Hypertension Mother   . Diabetes Mother     Social History Social History  Substance Use Topics  . Smoking status: Current Every Day Smoker    Packs/day: 1.00    Types: E-cigarettes  . Smokeless tobacco: Never Used     Comment: vape   . Alcohol use No     Comment: rarely on holidays     Allergies   Chantix [varenicline tartrate]   Review of Systems Review of Systems  All other systems reviewed and are negative.    Physical Exam Updated Vital Signs BP (!) 142/95 (BP Location: Left Arm)   Pulse 85   Temp 98.7 F (37.1 C) (Oral)   Resp 20   Ht 5\' 9"  (1.753 m)   Wt 72.6 kg (160 lb)   SpO2  100%   BMI 23.63 kg/m   Physical Exam  Constitutional: He is oriented to person, place, and time. He appears well-developed and well-nourished.  HENT:  Head: Normocephalic.  Eyes: EOM are normal.  Neck: Normal range of motion.  Pulmonary/Chest: Effort normal.  Abdominal: He exhibits no distension.  Musculoskeletal: Normal range of motion.  Abscess and fullness of the distal phalanx of the right middle finger on the volar surface.  Able to flex the right middle finger.  No significant tenderness along the flexor sheath.  No significant tenderness of the right middle finger middle phalanx or proximal phalanx.  Neurological: He is alert and  oriented to person, place, and time.  Psychiatric: He has a normal mood and affect.  Nursing note and vitals reviewed.    ED Treatments / Results  Labs (all labs ordered are listed, but only abnormal results are displayed) Labs Reviewed  CBC  BASIC METABOLIC PANEL    EKG  EKG Interpretation None       Radiology Dg Finger Middle Right  Result Date: 07/02/2017 CLINICAL DATA:  Recent infection to the right index finger, new infection to right middle finger EXAM: RIGHT MIDDLE FINGER 2+V COMPARISON:  None. FINDINGS: There is no evidence of fracture or dislocation. There is no evidence of arthropathy or other focal bone abnormality. Soft tissues are unremarkable. IMPRESSION: Negative. Electronically Signed   By: Jasmine Pang M.D.   On: 07/02/2017 13:57    Procedures .Marland KitchenIncision and Drainage Performed by: Azalia Bilis Authorized by: Azalia Bilis    Consent: Verbal consent obtained. Risks and benefits: risks, benefits and alternatives were discussed Time out performed prior to procedure Type: abscess Body area: distal phalynx right middle finger Anesthesia: local infiltration Incision was made with a scalpel. Local anesthetic: nerve block (See note) Complexity: complex Blunt dissection to break up loculations Drainage: purulent Drainage amount: small Packing material: none Patient tolerance: Patient tolerated the procedure well with no immediate complications.   NERVE BLOCK Performed by: Lyanne Co Consent: Verbal consent obtained. Required items: required blood products, implants, devices, and special equipment available Time out: Immediately prior to procedure a "time out" was called to verify the correct patient, procedure, equipment, support staff and site/side marked as required. Indication: I and d of right middle finger Nerve block body site: digital nerve of right middle finger Preparation: Patient was prepped and draped in the usual sterile  fashion. Needle gauge: 24 G Location technique: anatomical landmarks Local anesthetic: lidocaine 2% without Anesthetic total: 5 ml Outcome: pain improved Patient tolerance: Patient tolerated the procedure well with no immediate complications.        Medications Ordered in ED Medications  doxycycline (VIBRA-TABS) tablet 100 mg (not administered)  morphine 4 MG/ML injection 4 mg (4 mg Intravenous Given 07/02/17 1323)  lidocaine (XYLOCAINE) 2 % injection 20 mL (20 mLs Infiltration Given 07/02/17 1533)     Initial Impression / Assessment and Plan / ED Course  I have reviewed the triage vital signs and the nursing notes.  Pertinent labs & imaging results that were available during my care of the patient were reviewed by me and considered in my medical decision making (see chart for details).     Not convinced this is involving the flexor sheath or is flexor tenosynovitis at this time.  Simple incision and drainage of the distal phalanx results in purulent material.  Home on doxycycline.  I have asked that he return to the emergency department in 48 hours for recheck.  Patient  understands return to the ER for worsening symptoms.  Final Clinical Impressions(s) / ED Diagnoses   Final diagnoses:  Abscess of right middle finger    New Prescriptions New Prescriptions   DOXYCYCLINE (VIBRAMYCIN) 100 MG CAPSULE    Take 1 capsule (100 mg total) by mouth 2 (two) times daily.     Azalia Bilis, MD 07/02/17 [phone]

## 2017-07-02 NOTE — ED Notes (Signed)
Bed: WA07 Expected date:  Expected time:  Means of arrival:  Comments: 

## 2017-07-02 NOTE — ED Triage Notes (Signed)
Pt c/o R middle finger pain and swelling x 3 days.

## 2017-08-07 DIAGNOSIS — R42 Dizziness and giddiness: Secondary | ICD-10-CM | POA: Insufficient documentation

## 2017-08-07 DIAGNOSIS — Z5321 Procedure and treatment not carried out due to patient leaving prior to being seen by health care provider: Secondary | ICD-10-CM | POA: Insufficient documentation

## 2017-08-08 ENCOUNTER — Emergency Department (HOSPITAL_COMMUNITY)
Admission: EM | Admit: 2017-08-08 | Discharge: 2017-08-08 | Disposition: A | Discharge status: Home / Self Care | Payer: Self-pay | Attending: Emergency Medicine | Admitting: Emergency Medicine

## 2017-08-08 ENCOUNTER — Encounter (HOSPITAL_COMMUNITY): Payer: Self-pay | Admitting: Nurse Practitioner

## 2017-08-08 NOTE — ED Triage Notes (Signed)
Pt is reporting head pain, states he hit his head on the wall surface. Family at bedside state he is not acting himself.

## 2017-08-08 NOTE — ED Notes (Signed)
Pt left, stating he didn't want to wait for a room

## 2017-10-26 ENCOUNTER — Encounter (HOSPITAL_COMMUNITY): Payer: Self-pay | Admitting: Emergency Medicine

## 2017-10-26 DIAGNOSIS — Z5321 Procedure and treatment not carried out due to patient leaving prior to being seen by health care provider: Secondary | ICD-10-CM | POA: Insufficient documentation

## 2017-10-26 DIAGNOSIS — L02414 Cutaneous abscess of left upper limb: Secondary | ICD-10-CM | POA: Insufficient documentation

## 2017-10-26 LAB — CBC WITH DIFFERENTIAL/PLATELET
BASOS PCT: 0 %
Basophils Absolute: 0 10*3/uL (ref 0.0–0.1)
Eosinophils Absolute: 0.1 10*3/uL (ref 0.0–0.7)
Eosinophils Relative: 1 %
HEMATOCRIT: 42.8 % (ref 39.0–52.0)
HEMOGLOBIN: 15.1 g/dL (ref 13.0–17.0)
LYMPHS ABS: 2.1 10*3/uL (ref 0.7–4.0)
Lymphocytes Relative: 18 %
MCH: 30.8 pg (ref 26.0–34.0)
MCHC: 35.3 g/dL (ref 30.0–36.0)
MCV: 87.2 fL (ref 78.0–100.0)
MONO ABS: 0.7 10*3/uL (ref 0.1–1.0)
MONOS PCT: 6 %
NEUTROS ABS: 8.7 10*3/uL — AB (ref 1.7–7.7)
NEUTROS PCT: 75 %
Platelets: 323 10*3/uL (ref 150–400)
RBC: 4.91 MIL/uL (ref 4.22–5.81)
RDW: 12.7 % (ref 11.5–15.5)
WBC: 11.6 10*3/uL — ABNORMAL HIGH (ref 4.0–10.5)

## 2017-10-26 LAB — I-STAT CG4 LACTIC ACID, ED: LACTIC ACID, VENOUS: 0.57 mmol/L (ref 0.5–1.9)

## 2017-10-26 LAB — COMPREHENSIVE METABOLIC PANEL
ALBUMIN: 4.5 g/dL (ref 3.5–5.0)
ALK PHOS: 83 U/L (ref 38–126)
ALT: 28 U/L (ref 17–63)
ANION GAP: 11 (ref 5–15)
AST: 27 U/L (ref 15–41)
BUN: 13 mg/dL (ref 6–20)
CALCIUM: 9.5 mg/dL (ref 8.9–10.3)
CO2: 29 mmol/L (ref 22–32)
Chloride: 99 mmol/L — ABNORMAL LOW (ref 101–111)
Creatinine, Ser: 0.89 mg/dL (ref 0.61–1.24)
GFR calc non Af Amer: >60 mL/min (ref >60)
GLUCOSE: 93 mg/dL (ref 65–99)
Potassium: 3.4 mmol/L — ABNORMAL LOW (ref 3.5–5.1)
SODIUM: 139 mmol/L (ref 135–145)
Total Bilirubin: 1.9 mg/dL — ABNORMAL HIGH (ref 0.3–1.2)
Total Protein: 7.6 g/dL (ref 6.5–8.1)

## 2017-10-26 NOTE — ED Notes (Signed)
No answer when called to reassess vitals. 

## 2017-10-26 NOTE — ED Triage Notes (Signed)
Patient c/o abscess on left wrist that came up yesterday. Erythema and swelling is going up forearm. Denies any drainage.

## 2017-10-27 ENCOUNTER — Emergency Department (HOSPITAL_COMMUNITY)
Admission: EM | Admit: 2017-10-27 | Discharge: 2017-10-27 | Disposition: A | Discharge status: Home / Self Care | Payer: Self-pay | Attending: Emergency Medicine | Admitting: Emergency Medicine

## 2017-11-29 ENCOUNTER — Observation Stay (HOSPITAL_COMMUNITY)
Admission: EM | Admit: 2017-11-29 | Discharge: 2017-11-30 | Disposition: A | Discharge status: Home / Self Care | Payer: Self-pay | Attending: Internal Medicine | Admitting: Internal Medicine

## 2017-11-29 ENCOUNTER — Encounter (HOSPITAL_COMMUNITY): Payer: Self-pay | Admitting: Emergency Medicine

## 2017-11-29 DIAGNOSIS — F909 Attention-deficit hyperactivity disorder, unspecified type: Secondary | ICD-10-CM | POA: Insufficient documentation

## 2017-11-29 DIAGNOSIS — F1990 Other psychoactive substance use, unspecified, uncomplicated: Secondary | ICD-10-CM | POA: Diagnosis present

## 2017-11-29 DIAGNOSIS — F17210 Nicotine dependence, cigarettes, uncomplicated: Secondary | ICD-10-CM | POA: Insufficient documentation

## 2017-11-29 DIAGNOSIS — F1410 Cocaine abuse, uncomplicated: Secondary | ICD-10-CM | POA: Insufficient documentation

## 2017-11-29 DIAGNOSIS — Z8661 Personal history of infections of the central nervous system: Secondary | ICD-10-CM | POA: Insufficient documentation

## 2017-11-29 DIAGNOSIS — L0291 Cutaneous abscess, unspecified: Secondary | ICD-10-CM

## 2017-11-29 DIAGNOSIS — L02414 Cutaneous abscess of left upper limb: Principal | ICD-10-CM | POA: Insufficient documentation

## 2017-11-29 DIAGNOSIS — L03114 Cellulitis of left upper limb: Secondary | ICD-10-CM | POA: Insufficient documentation

## 2017-11-29 DIAGNOSIS — F1110 Opioid abuse, uncomplicated: Secondary | ICD-10-CM | POA: Insufficient documentation

## 2017-11-29 DIAGNOSIS — Z888 Allergy status to other drugs, medicaments and biological substances status: Secondary | ICD-10-CM | POA: Insufficient documentation

## 2017-11-29 MED ORDER — SODIUM CHLORIDE 0.9 % IV SOLN
100.0000 mg | Freq: Once | INTRAVENOUS | Status: AC
  Administered 2017-11-30: 100 mg via INTRAVENOUS
  Filled 2017-11-29: qty 100

## 2017-11-29 MED ORDER — LIDOCAINE HCL (PF) 1 % IJ SOLN
20.0000 mL | Freq: Once | INTRAMUSCULAR | Status: AC
  Administered 2017-11-30: 20 mL via INTRADERMAL
  Filled 2017-11-29: qty 30

## 2017-11-29 NOTE — ED Triage Notes (Addendum)
Patient c/o left arm pain with two wounds "that just showed up" x2 days ago. Wounds red and swollen. Hx IV drug use, however denies at this time.

## 2017-11-29 NOTE — ED Provider Notes (Signed)
[facility]-EMERGENCY DEPT Provider Note   CSN: [id number] Arrival date & time: 11/29/17  2145     History   Chief Complaint Chief Complaint  Patient presents with  . Arm Pain    HPI Jacob Newton is a 32 y.o. male with PMH/o opiate abuse, IV drug use who presents for evaluation of left upper extremity redness, swelling, erythema that is been ongoing for the last 3 days.  Patient reports worsening pain to the area.  He states that pain is worsened with movement of the left upper extremity.  Patient states that he has a history of IV drug use and where the abscesses  is where he used to shoot up.  He states that he has not used in several months.  Patient states he has not taking medications for the pain.  He states that the area has been draining.  Patient denies any fevers, numbness/weakness.  The history is provided by the patient.    Past Medical History:  Diagnosis Date  . ADHD (attention deficit hyperactivity disorder)   . Back pain    chronic low back pain, with L%-S1 stenosis/bulge sisc/arthropathy  . Closed lumbar fx w/ cord inj 2010   hx lumbar fx  . Opiate abuse, continuous (HCC)   . 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 Viral meningitis 7/15    Patient Active Problem List   Diagnosis Date Noted  . Overdose of opiate or related narcotic, accidental or unintentional, initial encounter (HCC) 08/11/2016  . Major depressive disorder, severe (HCC) 08/10/2016  . Migraine syndrome 06/20/2016  . 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 Adjustment reaction 01/14/2016  . Screening for lipoid disorders 05/21/2015  . Adverse effects of medication 09/05/2013  . ADD (attention deficit disorder) 06/28/2012  . Poor concentration 02/23/2012  . Routine general medical examination at a health care facility 02/12/2012  . Smoker 08/22/2011  . Chronic back pain 02/07/2010    Past Surgical History:  Procedure Laterality Date  .  I&D EXTREMITY Right 05/17/2017   Procedure: IRRIGATION AND DEBRIDEMENT EXTREMITY/RIGHT INDEX FINGER;  Surgeon: Sheral ApleyMurphy, Timothy D, MD;  Location: [facility] OR;  Service: Orthopedics;  Laterality: Right;        Home Medications    Prior to Admission medications   Medication Sig Start Date End Date Taking? Authorizing Provider  Aspirin-Salicylamide-Caffeine (BC HEADACHE POWDER PO) Take 1 packet by mouth 2 (two) times daily as needed (mild pain/headache).   Yes [provider]  doxycycline (VIBRAMYCIN) 100 MG capsule Take 1 capsule (100 mg total) by mouth 2 (two) times daily. Patient not taking: Reported on 11/30/2017 07/02/17   Azalia Bilisampos, Kevin, MD    Family History Family History  Problem Relation Age of Onset  . Hypertension Mother   . Diabetes Mother     Social History Social History   Tobacco Use  . Smoking status: Current Every Day Smoker    Packs/day: 1.00    Types: E-cigarettes  . Smokeless tobacco: Never Used  . Tobacco comment: vape   Substance Use Topics  . Alcohol use: No    Alcohol/week: 0.0 oz    Comment: rarely on holidays  . Drug use: Yes    Types: IV, Heroin, Cocaine    Comment: opiates, heroin     Allergies   Chantix [varenicline tartrate]   Review of Systems Review of Systems  Constitutional: Negative for fever.  Respiratory: Negative for shortness of breath.   Cardiovascular: Negative for  chest pain.  Skin: Positive for color change and wound.  Neurological: Negative for weakness and numbness.  All other systems reviewed and are negative.    Physical Exam Updated Vital Signs BP 123/83 (BP Location: Left Arm)   Pulse 74   Temp 98.1 F (36.7 C) (Oral)   Resp 20   SpO2 95%   Physical Exam  Constitutional: He is oriented to person, place, and time. He appears well-developed and well-nourished.  HENT:  Head: Normocephalic and atraumatic.  Mouth/Throat: Oropharynx is clear and moist and mucous membranes are normal.  Eyes: Pupils are equal,  round, and reactive to light. Conjunctivae, EOM and lids are normal. Right eye exhibits no discharge. Left eye exhibits no discharge. No scleral icterus.  Neck: Full passive range of motion without pain.  Cardiovascular: Normal rate, regular rhythm, normal heart sounds and normal pulses. Exam reveals no gallop and no friction rub.  No murmur heard. Pulses:      Radial pulses are 2+ on the right side, and 2+ on the left side.  Pulmonary/Chest: Effort normal and breath sounds normal.  Abdominal: Soft. Normal appearance. There is no tenderness. There is no rigidity and no guarding.  Musculoskeletal: Normal range of motion.  Diffuse  tenderness palpation noted to the anterior aspect of the proximal left forearm.  Limited range of motion of left elbow secondary to patient's pain and symptoms.  No abnormalities of the right upper extremity.  Neurological: He is alert and oriented to person, place, and time.  Skin: Skin is warm and dry. Capillary refill takes less than 2 seconds.  Good distal cap refill. LUE is not dusky in appearance or cool to touch. 8x6 cm area of erythema, warmth and induration with central fluctuance noted to the anterior aspect of the left upper extremity.   Psychiatric: He has a normal mood and affect. His speech is normal and behavior is normal.  Nursing note and vitals reviewed.        ED Treatments / Results  Labs (all labs ordered are listed, but only abnormal results are displayed) Labs Reviewed  BASIC METABOLIC PANEL - Abnormal; Notable for the following components:      Result Value   Glucose, Bld 109 (*)    All other components within normal limits  AEROBIC CULTURE (SUPERFICIAL SPECIMEN)  GRAM STAIN  CBC WITH DIFFERENTIAL/PLATELET    EKG None  Radiology No results found.  Procedures .Marland KitchenIncision and Drainage Date/Time: 11/30/2017 12:25 AM Performed by: Maxwell Caul, PA-C Authorized by: Maxwell Caul, PA-C   Consent:    Consent given by:   Patient   Risks discussed:  Bleeding, incomplete drainage and infection Location:    Type:  Abscess   Location:  Upper extremity   Upper extremity location:  Arm   Arm location:  L lower arm Pre-procedure details:    Skin preparation:  Betadine Anesthesia (see MAR for exact dosages):    Anesthesia method:  Local infiltration   Local anesthetic:  Lidocaine 1% w/o epi Procedure type:    Complexity:  Simple Procedure details:    Incision types:  Stab incision   Scalpel blade:  11   Wound management:  Irrigated with saline   Drainage:  Purulent   Drainage amount:  Scant   Wound treatment:  Wound left open Post-procedure details:    Patient tolerance of procedure:  Tolerated well, no immediate complications Comments:     Once the patient was a probably anesthetized, the ultrasound was used to  evaluate the area.  There appears to be [place] abscess that is a just adjacent to the vein.  The area was thoroughly cleaned with Betadine and extensively and thoroughly irrigated with sterile saline.  A small superficial incision was made which really scant amount of purulent drainage.  The area was continued to be flushed.  Wound cultures were taken at that time. I&D as documented above.  Given that it was adjacent to the vein, the incision was only very superficial.  There was   (including critical care time)  Medications Ordered in ED Medications  doxycycline (VIBRAMYCIN) 100 mg in sodium chloride 0.9 % 250 mL IVPB (100 mg Intravenous New Bag/Given 11/30/17 0037)  doxycycline (VIBRA-TABS) tablet 100 mg (has no administration in time range)  lidocaine (PF) (XYLOCAINE) 1 % injection 20 mL (20 mLs Intradermal Given by Other 11/30/17 0041)  ketorolac (TORADOL) 30 MG/ML injection 30 mg (30 mg Intravenous Given 11/30/17 0041)     Initial Impression / Assessment and Plan / ED Course  I have reviewed the triage vital signs and the nursing notes.  Pertinent labs & imaging results that were available  during my care of the patient were reviewed by me and considered in my medical decision making (see chart for details).     32 year old male who presents for evaluation of left upper extremity pain, redness, swelling.  Does report a history of IV drug use.  States he has not used in several months.  No fevers.  Reports that swelling and redness have increased dramatically over the last 24 hours. Patient is afebrile, non-toxic appearing, sitting comfortably on examination table.  On exam, patient has diffuse erythema, warmth, tenderness noted to the anterior aspect of the left proximal forearm.  There appears to be central fluctuance noted with Vital signs reviewed and stable. Patient is neurovascularly intact.  Purulent drainage noted.  Suspect this is an abscess.  Plan to check basic labs.  Bedside ultrasound used to evaluate abscess.  There appears to be an abscess to the anterior aspect of the left upper extremity that is just adjacent to the left upper extremity vein.   I&D as documented above.  Given that the abscess was adjacent to the vein on the ultrasound, only performed a very superficial I&D.  Purulent drainage was noted.  Wound cultures were sent at that time.  IV antibiotic's initiated.  Given the extensiveness of abscess, will likely need hospital admission for IV antibiotics.  Final Clinical Impressions(s) / ED Diagnoses   Final diagnoses:  Cellulitis of left upper extremity  Abscess    ED Discharge Orders    None       Rosana Hoes 11/30/17 0129    Lorre Nick, MD 11/30/17 1655

## 2017-11-30 ENCOUNTER — Observation Stay (HOSPITAL_COMMUNITY): Payer: Self-pay

## 2017-11-30 ENCOUNTER — Encounter (HOSPITAL_COMMUNITY): Payer: Self-pay | Admitting: Emergency Medicine

## 2017-11-30 ENCOUNTER — Other Ambulatory Visit: Payer: Self-pay

## 2017-11-30 DIAGNOSIS — F1990 Other psychoactive substance use, unspecified, uncomplicated: Secondary | ICD-10-CM

## 2017-11-30 DIAGNOSIS — L02414 Cutaneous abscess of left upper limb: Secondary | ICD-10-CM | POA: Diagnosis present

## 2017-11-30 DIAGNOSIS — L03114 Cellulitis of left upper limb: Secondary | ICD-10-CM

## 2017-11-30 LAB — CBC WITH DIFFERENTIAL/PLATELET
BASOS PCT: 0 %
Basophils Absolute: 0 10*3/uL (ref 0.0–0.1)
EOS ABS: 0.2 10*3/uL (ref 0.0–0.7)
Eosinophils Relative: 2 %
HEMATOCRIT: 39.7 % (ref 39.0–52.0)
HEMOGLOBIN: 13.3 g/dL (ref 13.0–17.0)
Lymphocytes Relative: 22 %
Lymphs Abs: 1.8 10*3/uL (ref 0.7–4.0)
MCH: 29.6 pg (ref 26.0–34.0)
MCHC: 33.5 g/dL (ref 30.0–36.0)
MCV: 88.2 fL (ref 78.0–100.0)
Monocytes Absolute: 0.6 10*3/uL (ref 0.1–1.0)
Monocytes Relative: 7 %
NEUTROS ABS: 5.8 10*3/uL (ref 1.7–7.7)
NEUTROS PCT: 69 %
Platelets: 276 10*3/uL (ref 150–400)
RBC: 4.5 MIL/uL (ref 4.22–5.81)
RDW: 12.7 % (ref 11.5–15.5)
WBC: 8.3 10*3/uL (ref 4.0–10.5)

## 2017-11-30 LAB — BASIC METABOLIC PANEL
Anion gap: 9 (ref 5–15)
BUN: 14 mg/dL (ref 6–20)
CHLORIDE: 102 mmol/L (ref 101–111)
CO2: 28 mmol/L (ref 22–32)
CREATININE: 0.99 mg/dL (ref 0.61–1.24)
Calcium: 9.2 mg/dL (ref 8.9–10.3)
GFR calc non Af Amer: >60 mL/min (ref >60)
Glucose, Bld: 109 mg/dL — ABNORMAL HIGH (ref 65–99)
POTASSIUM: 3.6 mmol/L (ref 3.5–5.1)
SODIUM: 139 mmol/L (ref 135–145)

## 2017-11-30 LAB — GRAM STAIN: Special Requests: NORMAL

## 2017-11-30 LAB — HIV ANTIBODY (ROUTINE TESTING W REFLEX): HIV Screen 4th Generation wRfx: NONREACTIVE

## 2017-11-30 MED ORDER — IBUPROFEN 400 MG PO TABS: 400.0000 mg | ORAL_TABLET | Freq: Three times a day (TID) | ORAL | 0 refills | Status: AC | PRN

## 2017-11-30 MED ORDER — DOXYCYCLINE HYCLATE 100 MG PO TABS: 100.0000 mg | ORAL_TABLET | Freq: Two times a day (BID) | ORAL | 0 refills | Status: AC

## 2017-11-30 MED ORDER — ONDANSETRON HCL 4 MG PO TABS: 4.0000 mg | ORAL_TABLET | Freq: Four times a day (QID) | ORAL | Status: DC | PRN

## 2017-11-30 MED ORDER — KETOROLAC TROMETHAMINE 30 MG/ML IJ SOLN
30.0000 mg | Freq: Once | INTRAMUSCULAR | Status: AC
  Administered 2017-11-30: 30 mg via INTRAVENOUS
  Filled 2017-11-30: qty 1

## 2017-11-30 MED ORDER — ONDANSETRON HCL 4 MG/2ML IJ SOLN: 4.0000 mg | Freq: Four times a day (QID) | INTRAMUSCULAR | Status: DC | PRN

## 2017-11-30 MED ORDER — DOXYCYCLINE HYCLATE 100 MG PO TABS
100.0000 mg | ORAL_TABLET | Freq: Two times a day (BID) | ORAL | Status: DC
  Administered 2017-11-30: 100 mg via ORAL
  Filled 2017-11-30: qty 1

## 2017-11-30 MED ORDER — VANCOMYCIN HCL 10 G IV SOLR
1500.0000 mg | Freq: Once | INTRAVENOUS | Status: DC
  Filled 2017-11-30: qty 1500

## 2017-11-30 MED ORDER — ACETAMINOPHEN 650 MG RE SUPP: 650.0000 mg | Freq: Four times a day (QID) | RECTAL | Status: DC | PRN

## 2017-11-30 MED ORDER — LIDOCAINE-EPINEPHRINE 2 %-1:100000 IJ SOLN
20.0000 mL | INTRAMUSCULAR | Status: DC
  Filled 2017-11-30: qty 20

## 2017-11-30 MED ORDER — NICOTINE 14 MG/24HR TD PT24
14.0000 mg | MEDICATED_PATCH | Freq: Every day | TRANSDERMAL | Status: DC
  Administered 2017-11-30: 14 mg via TRANSDERMAL
  Filled 2017-11-30: qty 1

## 2017-11-30 MED ORDER — VANCOMYCIN HCL IN DEXTROSE 1-5 GM/200ML-% IV SOLN: 1000.0000 mg | Freq: Once | INTRAVENOUS | Status: DC

## 2017-11-30 MED ORDER — ACETAMINOPHEN 325 MG PO TABS
650.0000 mg | ORAL_TABLET | Freq: Four times a day (QID) | ORAL | Status: DC | PRN
  Administered 2017-11-30: 650 mg via ORAL
  Filled 2017-11-30: qty 2

## 2017-11-30 MED ORDER — ENOXAPARIN SODIUM 40 MG/0.4ML ~~LOC~~ SOLN
40.0000 mg | SUBCUTANEOUS | Status: DC
  Administered 2017-11-30: 40 mg via SUBCUTANEOUS
  Filled 2017-11-30: qty 0.4

## 2017-11-30 NOTE — Consult Note (Signed)
[facility] Surgery Consult Note  Jacob Newton [birth year]  [id number].    Requesting MD: Tawanna Solo, MD Chief Complaint/Reason for Consult: abscess of left arm  HPI:  Mr. Jacob Newton is a 32 y/o male with a history of IV drug use and tobacco abuse who presented to [facility] with a painful and erythematous left proximal forearm. He reports that the area has been "flaring up" off and on since August 2018. This particular episode of pain started about 3 days ago. Pain is non-radiating. Pain exacerbated by palpation. In the ED he was found to have an abscess that was drained in 11/29/17 at bedside by the ED PA-C. Pain improved s/p drainage. Patient reports smoking 2 packs of cigarettes per day. He endorses IV drug use in his bilateral arms but states he has not injected opiates in a couple months. He reports drinking alcohol 1-2x per month. Denies use of steroid medications. Reports a history of foreign body removal from a finger on his right hand. He is currently employed working on Development worker, international [employer]. General surgery was asked to consult to r/o the need for further drainage.  ROS: Review of Systems  Constitutional: Negative for chills and fever.  Cardiovascular: Negative for chest pain.  Skin:       Left arm pain and swelling  All other systems reviewed and are negative.   Family History  Problem Relation Age of Onset  . Hypertension Mother   . Diabetes Mother     Past Medical History:  Diagnosis Date  . ADHD (attention deficit hyperactivity disorder)   . Back pain    chronic low back pain, with L%-S1 stenosis/bulge sisc/arthropathy  . Closed lumbar fx w/ cord inj 2010   hx lumbar fx  . Opiate abuse, continuous ([place])   . 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 Viral meningitis 7/15    Past Surgical History:  Procedure Laterality Date  . I&D EXTREMITY Right 05/17/2017   Procedure: IRRIGATION AND DEBRIDEMENT EXTREMITY/RIGHT INDEX FINGER;  Surgeon: Renette Butters, MD;  Location: [place];  Service: Orthopedics;  Laterality: Right;    Social History:  reports that he has been smoking e-cigarettes.  He has been smoking about 1.00 pack per day. He has never used smokeless tobacco. He reports that he has current or past drug history. Drugs: IV, Heroin, and Cocaine. He reports that he does not drink alcohol.  Allergies:  Allergies  Allergen Reactions  . Chantix [Varenicline Tartrate] Other (See Comments)    Bad dreams    Medications Prior to Admission  Medication Sig Dispense Refill  . Aspirin-Salicylamide-Caffeine (BC HEADACHE POWDER PO) Take 1 packet by mouth 2 (two) times daily as needed (mild pain/headache).    . doxycycline (VIBRAMYCIN) 100 MG capsule Take 1 capsule (100 mg total) by mouth 2 (two) times daily. (Patient not taking: Reported on 11/30/2017) 14 capsule 0    Blood pressure 122/88, pulse 88, temperature 98.7 F (37.1 C), temperature source Oral, resp. rate 16, height _0  (1.753 m), weight 68.1 kg (150 lb 2.1 oz), SpO2 98 %. Physical Exam: Physical Exam  Constitutional: He appears well-developed and well-nourished. No distress.  HENT:  Head: Normocephalic and atraumatic.  Right Ear: External ear normal.  Left Ear: External ear normal.  Nose: Nose normal.  Eyes: EOM are normal. Right eye exhibits no discharge. Left eye exhibits no discharge. No scleral icterus.  Neck: Normal range of motion. Neck supple. No tracheal deviation present.  Cardiovascular: Normal  rate, regular rhythm and normal heart sounds.  No murmur heard. Pulmonary/Chest: Effort normal and breath sounds normal. No respiratory distress. He exhibits no tenderness.  Abdominal: Soft. Bowel sounds are normal. He exhibits no distension. There is no tenderness.  Musculoskeletal: Normal range of motion. He exhibits no edema or deformity.  Skin: Skin is warm and dry. He is not diaphoretic.  Left proximal and anterior forearm with < 1cm skin opening and 2-3 cm  surrounding area of erythema and induration.   Results for orders placed or performed during the hospital encounter of 11/29/17 (from the past 48 hour(s))  CBC with Differential     Status: None   Collection Time: 11/29/17 11:44 PM  Result Value Ref Range   WBC 8.3 4.0 - 10.5 K/uL   RBC 4.50 4.22 - 5.81 MIL/uL   Hemoglobin 13.3 13.0 - 17.0 g/dL   HCT 39.7 39.0 - 52.0 %   MCV 88.2 78.0 - 100.0 fL   MCH 29.6 26.0 - 34.0 pg   MCHC 33.5 30.0 - 36.0 g/dL   RDW 12.7 11.5 - 15.5 %   Platelets 276 150 - 400 K/uL   Neutrophils Relative % 69 %   Neutro Abs 5.8 1.7 - 7.7 K/uL   Lymphocytes Relative 22 %   Lymphs Abs 1.8 0.7 - 4.0 K/uL   Monocytes Relative 7 %   Monocytes Absolute 0.6 0.1 - 1.0 K/uL   Eosinophils Relative 2 %   Eosinophils Absolute 0.2 0.0 - 0.7 K/uL   Basophils Relative 0 %   Basophils Absolute 0.0 0.0 - 0.1 K/uL    Comment: Performed at [facility], [place] [address], [place], [place] [address]  Basic metabolic panel     Status: Abnormal   Collection Time: 11/29/17 11:44 PM  Result Value Ref Range   Sodium 139 135 - 145 mmol/L   Potassium 3.6 3.5 - 5.1 mmol/L   Chloride 102 101 - 111 mmol/L   CO2 28 22 - 32 mmol/L   Glucose, Bld 109 (H) 65 - 99 mg/dL   BUN 14 6 - 20 mg/dL   Creatinine, Ser 0.99 0.61 - 1.24 mg/dL   Calcium 9.2 8.9 - 10.3 mg/dL   GFR calc non Af Amer >60 >60 mL/min   GFR calc Af Amer >60 >60 mL/min    Comment: (NOTE) The eGFR has been calculated using the CKD EPI equation. This calculation has not been validated in all clinical situations. eGFR's persistently <60 mL/min signify possible Chronic Kidney Disease.    Anion gap 9 5 - 15    Comment: Performed at [facility], [place] [address], [place], [place] [address]  Wound or Superficial Culture     Status: None (Preliminary result)   Collection Time: 11/30/17 12:42 AM  Result Value Ref Range   Specimen Description      ABSCESS Performed at [place] [address], [place], [place] [address]    Special Requests NONE    Gram Stain PENDING    Culture PENDING    Report Status PENDING   Stat Gram stain     Status: None   Collection Time: 11/30/17 12:42 AM  Result Value Ref Range   Specimen Description WOUND    Special Requests Normal    Gram Stain      MODERATE WBC PRESENT,BOTH PMN AND MONONUCLEAR RARE GRAM POSITIVE COCCI IN PAIRS IN CLUSTERS RESULT CALLED TO, READ BACK BY AND VERIFIED WITH: S DOSTER,RN _0   11/30/17 MKELLY Performed at [facility], [place] [address] [place],  [address]    Report Status 11/30/2017 FINAL    No results found.  Assessment/Plan Abscess of left proximal forearm  - area still with small area of fluctuance and surrounding cellulitis  - order X-ray to r/o retained foreign body  - abscess may benefit from further draingae, either by general surgery or hand surgery if there is concern for joint involvement.  - will discuss with MD and follow up after x-rays   Jill Alexanders, [facility] Surgery 11/30/2017, 1:37 PM Pager: [phone] Consults: [phone] Mon-Fri 7:00 am-4:30 pm Sat-Sun 7:00 am-11:30 am

## 2017-11-30 NOTE — ED Notes (Signed)
Julian ReilGardner, MD paged about gram stain results and nurse Junior made aware

## 2017-11-30 NOTE — Progress Notes (Signed)
Lab called and asked to change the culture from fluid to a wound culture since it came from his arm.

## 2017-11-30 NOTE — Procedures (Signed)
Incision and Drainage Procedure Note  Pre-operative Diagnosis: left forearm abscess  Post-operative Diagnosis: same  Indications: history of IV drug abuse.  Last injection was in August of 2018.  He has had issues with this left arm with intermittent boils since then.  However, this particular one has been worse.  He presented to the ED where he was admitted for abx therapy and incision and drainage.  Anesthesia: 2% lidocaine with epinephrine  Procedure Details  The procedure, risks and complications have been discussed in detail (including, but not limited to infection, bleeding) with the patient, and the patient has signed consent to the procedure.  The skin was sterilely prepped over the affected area in the usual fashion. After adequate local anesthesia, I&D with a #11 blade was performed on the left, proximal forearm abscess.  A 1x1x1cm wound was created. Purulent drainage: absent The patient was observed until stable.  Findings: Indurated, erythematous tissues with some necrotic subcutaneous tissue present.  EBL: 0 cc's  Drains: none  Condition: Tolerated procedure well   Complications: none.  Letha CapeKelly E Kaydee Magel 4:04 PM 11/30/2017

## 2017-11-30 NOTE — Discharge Instructions (Signed)
Wound Care, Adult Taking care of your wound properly can help to prevent pain and infection. It can also help your wound to heal more quickly. How is this treated? Wound care  Follow instructions from your health care provider about how to take care of your wound. Make sure you: ? Wash your hands with soap and water before you change the bandage (dressing). If soap and water are not available, use hand sanitizer. ? Change your dressing as told by your health care provider. ? Remove packing tomorrow.  Shower.  Place a dry gauze and tape over the wound daily until healed.  Check your wound area every day for signs of infection. Check for: ? More redness, swelling, or pain. ? More fluid or blood. ? Warmth. ? Pus or a bad smell.  Ask your health care provider if you should clean the wound with mild soap and water. Doing this may include: ? Using a clean towel to pat the wound dry after cleaning it. Do not rub or scrub the wound. ? Applying a cream or ointment. Do this only as told by your health care provider. ? Covering the incision with a clean dressing.  Ask your health care provider when you can leave the wound uncovered. Medicines   Take over-the-counter and prescription medicines only as told by your health care provider. If you were prescribed pain medicine, take it at least 30 minutes before doing any wound care or as told by your health care provider. General instructions  Return to your normal activities as told by your health care provider. Ask your health care provider what activities are safe.  Do not scratch or pick at the wound.  Keep all follow-up visits as told by your health care provider. This is important.  Eat a diet that includes protein, vitamin A, vitamin C, and other nutrient-rich foods. These help the wound heal: ? Protein-rich foods include meat, dairy, beans, nuts, and other sources. ? Vitamin A-rich foods include carrots and dark green, leafy  vegetables. ? Vitamin C-rich foods include citrus, tomatoes, and other fruits and vegetables. ? Nutrient-rich foods have protein, carbohydrates, fat, vitamins, or minerals. Eat a variety of healthy foods including vegetables, fruits, and whole grains. Contact a health care provider if:  You received a tetanus shot and you have swelling, severe pain, redness, or bleeding at the injection site.  Your pain is not controlled with medicine.  You have more redness, swelling, or pain around the wound.  You have more fluid or blood coming from the wound.  Your wound feels warm to the touch.  You have pus or a bad smell coming from the wound.  You have a fever or chills.  You are nauseous or you vomit.  You are dizzy. Get help right away if:  You have a red streak going away from your wound.  The edges of the wound open up and separate.  Your wound is bleeding and the bleeding does not stop with gentle pressure.  You have a rash.  You faint.  You have trouble breathing. This information is not intended to replace advice given to you by your health care provider. Make sure you discuss any questions you have with your health care provider. Document Released: 06/03/2008 Document Revised: 04/23/2016 Document Reviewed: 03/11/2016 Elsevier Interactive Patient Education  2017 [employer]

## 2017-11-30 NOTE — Progress Notes (Signed)
Pt was alert, oriented and tolerating diet.  D/C instructions and prescription was given.  All questions were answered. Pt was d/cd home.

## 2017-11-30 NOTE — Progress Notes (Signed)
Spoke with patient at bedside regarding d/c plans. When asked about PCP, patient states he sees Dr. Milinda Antisower, he has not seen her in the last year. He plans to call to make a f/u appt with her. He states he has a way home, can afford his meds, has no other d/c needs. Provided him with information on [facility] if he is unable to get in with his PCP. [phone]

## 2017-11-30 NOTE — Progress Notes (Signed)
A consult was received from an ED physician for vancomcyin per pharmacy dosing.  The patient's profile has been reviewed for ht/wt/allergies/indication/available labs.   A one time order has been placed for Vancomycin 1500 mg.  Further antibiotics/pharmacy consults should be ordered by admitting physician if indicated.                       Thank you, Jacob Newton, Jacob Newton 11/30/2017  12:53 AM

## 2017-11-30 NOTE — Discharge Summary (Signed)
Physician Discharge Summary  Council Munguia Newby ZOX:[id number] DOB: 1986/03/13 DOA: 11/29/2017  PCP: Judy Pimple, MD  Admit date: 11/29/2017 Discharge date: 11/30/2017  Admitted From: Home Disposition:  Home  Discharge Condition:Stable CODE STATUS:Full Diet recommendation:  Regular   Brief/Interim Summary: Admission WJX:BJYNWGNFA S Jacob Newton is a 32 y.o. male with medical history significant of IVDU.  Patient presents to the ED with abscess and cellulitis of L forearm.  Claims he hasnt been using IV drugs "for a couple of months" but does admit that the abscess is located right where he "was injecting before".  No treatments attempted. ED course: Abscess I+Dd by EDP. They didn't do deep I+D because its right next to / around a vein  Hospital Course: Patient was started on IV doxycycline. Patient seen and examined the bedside during my evaluation.  He continues to feel better but I noticed persistent induration and tenderness on the I&D site on his left  forearm.  General surgery consulted and they did re-I&D. Patient was cleared by surgery for discharge with oral antibiotics.  X-ray of the left upper extremity did not show any foreign body or acute osseous abnormality. Patient discharged home with oral doxycycline. He will follow-up with general surgery as an outpatient    Discharge Diagnoses:  Principal Problem:   Abscess of forearm, left Active Problems:   IVDU (intravenous drug user)    Discharge Instructions  Discharge Instructions    Diet - low sodium heart healthy   Complete by:  As directed    Discharge instructions   Complete by:  As directed    1) Follow up with surgery as instructed.  Name and number of the provider has been aattached. 2)Follow up with your PMD in a week. 3) Take prescribed medications as instructed.   Increase activity slowly   Complete by:  As directed      Allergies as of 11/30/2017      Reactions   Chantix [varenicline Tartrate] Other (See  Comments)   Bad dreams      Medication List    STOP taking these medications   doxycycline 100 MG capsule Commonly known as:  VIBRAMYCIN Replaced by:  doxycycline 100 MG tablet     TAKE these medications   BC HEADACHE POWDER PO Take 1 packet by mouth 2 (two) times daily as needed (mild pain/headache).   doxycycline 100 MG tablet Commonly known as:  VIBRA-TABS Take 1 tablet (100 mg total) by mouth every 12 (twelve) hours for 7 days. Replaces:  doxycycline 100 MG capsule      Follow-up Information    [facility] Follow up.   Why:  call if primary PCP can't see you Contact information: [address] [place] [address] [phone]       Judy Pimple, MD. Schedule an appointment as soon as possible for a visit in 2 week(s).   Specialties:  Family Medicine, Radiology Contact information: [address] [place] [address] [phone]        [facility] [place] Follow up in 2 week(s).   Specialty:  General Surgery Why:  Our office will call you with appointment date and time.  You will see the [facility] clinic for a wound check Contact information: [address] [place] [address] [phone]         Allergies  Allergen Reactions  . Chantix [Varenicline Tartrate] Other (See Comments)    Bad dreams  Consultations: General surgery  Procedures/Studies: Dg Elbow 2 Views Left  Result Date: 11/30/2017 CLINICAL DATA:  Question foreign body question abscess EXAM: LEFT ELBOW - 2 VIEW COMPARISON:  11/18/2009 FINDINGS: No fracture or malalignment. No significant elbow effusion. No radiopaque foreign body. Soft tissue thickening proximal forearm. IMPRESSION: 1. No acute osseous abnormality or radiopaque foreign body 2. Soft tissue thickening/swelling over the proximal forearm. Electronically Signed   By: Jasmine PangKim  Fujinaga M.D.   On: 11/30/2017 15:04    (Echo,  Carotid, EGD, Colonoscopy, ERCP)    Subjective: Patient seen and examined the bedside.  Feels better and wants to go home.  Discharge Exam: Vitals:   11/30/17 0640 11/30/17 1408  BP: 122/88 115/70  Pulse: 88 62  Resp: 16 16  Temp: 98.7 F (37.1 C) 98.4 F (36.9 C)  SpO2: 98% 99%   Vitals:   11/30/17 0200 11/30/17 0230 11/30/17 0640 11/30/17 1408  BP:  120/77 122/88 115/70  Pulse:  77 88 62  Resp:  20 16 16   Temp:  98.8 F (37.1 C) 98.7 F (37.1 C) 98.4 F (36.9 C)  TempSrc:  Oral Oral Oral  SpO2:  99% 98% 99%  Weight: 68.1 kg (150 lb 2.1 oz)     Height: 5\' 9"  (1.753 m)       General: Pt is alert, awake, not in acute distress Cardiovascular: RRR, S1/S2 +, no rubs, no gallops Respiratory: CTA bilaterally, no wheezing, no rhonchi Abdominal: Soft, NT, ND, bowel sounds + Extremities: no edema, no cyanosis, incision and drainage site on the left forearm, surrounding area of inflammation    The results of significant diagnostics from this hospitalization (including imaging, microbiology, ancillary and laboratory) are listed below for reference.     Microbiology: Recent Results (from the past 240 hour(s))  Wound or Superficial Culture     Status: None (Preliminary result)   Collection Time: 11/30/17 12:42 AM  Result Value Ref Range Status   Specimen Description   Final    ABSCESS Performed at [facility], [address] [place], [place] [address]    Special Requests NONE  Final   Gram Stain PENDING  Incomplete   Culture PENDING  Incomplete   Report Status PENDING  Incomplete  Stat Gram stain     Status: None   Collection Time: 11/30/17 12:42 AM  Result Value Ref Range Status   Specimen Description WOUND  Final   Special Requests Normal  Final   Gram Stain   Final    MODERATE WBC PRESENT,BOTH PMN AND MONONUCLEAR RARE GRAM POSITIVE COCCI IN PAIRS IN CLUSTERS RESULT CALLED TO, READ BACK BY AND VERIFIED WITH: S DOSTER,RN @0223  11/30/17  MKELLY Performed at [facility], [address] [place], [place] [address]    Report Status 11/30/2017 FINAL  Final     Labs: BNP (last 3 results) No results for input(s): BNP in the last 8760 hours. Basic Metabolic Panel: Recent Labs  Lab 11/29/17 2344  NA 139  K 3.6  CL 102  CO2 28  GLUCOSE 109*  BUN 14  CREATININE 0.99  CALCIUM 9.2   Liver Function Tests: No results for input(s): AST, ALT, ALKPHOS, BILITOT, PROT, ALBUMIN in the last 168 hours. No results for input(s): LIPASE, AMYLASE in the last 168 hours. No results for input(s): AMMONIA in the last 168 hours. CBC: Recent Labs  Lab 11/29/17 2344  WBC 8.3  NEUTROABS 5.8  HGB 13.3  HCT 39.7  MCV 88.2  PLT  276   Cardiac Enzymes: No results for input(s): CKTOTAL, CKMB, CKMBINDEX, TROPONINI in the last 168 hours. BNP: Invalid input(s): POCBNP CBG: No results for input(s): GLUCAP in the last 168 hours. D-Dimer No results for input(s): DDIMER in the last 72 hours. Hgb A1c No results for input(s): HGBA1C in the last 72 hours. Lipid Profile No results for input(s): CHOL, HDL, LDLCALC, TRIG, CHOLHDL, LDLDIRECT in the last 72 hours. Thyroid function studies No results for input(s): TSH, T4TOTAL, T3FREE, THYROIDAB in the last 72 hours.  Invalid input(s): FREET3 Anemia work up No results for input(s): VITAMINB12, FOLATE, FERRITIN, TIBC, IRON, RETICCTPCT in the last 72 hours. Urinalysis    Component Value Date/Time   COLORURINE YELLOW 10/24/2014 1306   APPEARANCEUR CLEAR 10/24/2014 1306   LABSPEC 1.038 (H) 10/24/2014 1306   PHURINE 5.5 10/24/2014 1306   GLUCOSEU NEGATIVE 10/24/2014 1306   HGBUR NEGATIVE 10/24/2014 1306   BILIRUBINUR NEGATIVE 10/24/2014 1306   KETONESUR NEGATIVE 10/24/2014 1306   PROTEINUR NEGATIVE 10/24/2014 1306   UROBILINOGEN 0.2 10/24/2014 1306   NITRITE NEGATIVE 10/24/2014 1306   LEUKOCYTESUR NEGATIVE 10/24/2014 1306   Sepsis Labs Invalid input(s): PROCALCITONIN,   WBC,  LACTICIDVEN Microbiology Recent Results (from the past 240 hour(s))  Wound or Superficial Culture     Status: None (Preliminary result)   Collection Time: 11/30/17 12:42 AM  Result Value Ref Range Status   Specimen Description   Final    ABSCESS Performed at [facility], [address] [place], [place] [address]    Special Requests NONE  Final   Gram Stain PENDING  Incomplete   Culture PENDING  Incomplete   Report Status PENDING  Incomplete  Stat Gram stain     Status: None   Collection Time: 11/30/17 12:42 AM  Result Value Ref Range Status   Specimen Description WOUND  Final   Special Requests Normal  Final   Gram Stain   Final    MODERATE WBC PRESENT,BOTH PMN AND MONONUCLEAR RARE GRAM POSITIVE COCCI IN PAIRS IN CLUSTERS RESULT CALLED TO, READ BACK BY AND VERIFIED WITH: S DOSTER,RN @0223  11/30/17 MKELLY Performed at [facility], [address], [place], [place] [address]    Report Status 11/30/2017 FINAL  Final     Time coordinating discharge: Over 30 minutes  SIGNED:   Burnadette Pop, MD  [facility] 11/30/2017, 4:34 PM Pager [phone]  If 7PM-7AM, please contact night-coverage [email or web address] Password [email or web address]

## 2017-11-30 NOTE — H&P (Signed)
History and Physical    Jacob Newton FAO:[id number] DOB: May 21, 1986 DOA: 11/29/2017  PCP: Judy Pimple, MD  Patient coming from: Home  I have personally briefly reviewed patient's old medical records in [facility]  Chief Complaint: Abscess and cellulitis of L forearm  HPI: Jacob Newton is a 32 y.o. male with medical history significant of IVDU.  Patient presents to the ED with abscess and cellulitis of L forearm.  Claims he hasnt been using IV drugs "for a couple of months" but does admit that the abscess is located right where he "was injecting before".  No treatments attempted.   ED Course: Abscess I+Dd by EDP. They didn't do deep I+D because its right next to / around a vein.  EDP started him on doxycycline.  EDP wants patient observed.   Review of Systems: As per HPI otherwise 10 point review of systems negative.   Past Medical History:  Diagnosis Date  . ADHD (attention deficit hyperactivity disorder)   . Back pain    chronic low back pain, with L%-S1 stenosis/bulge sisc/arthropathy  . Closed lumbar fx w/ cord inj 2010   hx lumbar fx  . Opiate abuse, continuous (HCC)   . 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 Viral meningitis 7/15    Past Surgical History:  Procedure Laterality Date  . I&D EXTREMITY Right 05/17/2017   Procedure: IRRIGATION AND DEBRIDEMENT EXTREMITY/RIGHT INDEX FINGER;  Surgeon: Sheral Apley, MD;  Location: [facility] OR;  Service: Orthopedics;  Laterality: Right;     reports that he has been smoking e-cigarettes.  He has been smoking about 1.00 pack per day. He has never used smokeless tobacco. He reports that he has current or past drug history. Drugs: IV, Heroin, and Cocaine. He reports that he does not drink alcohol.  Allergies  Allergen Reactions  . Chantix [Varenicline Tartrate] Other (See Comments)    Bad dreams    Family History  Problem Relation Age of Onset  . Hypertension Mother   . Diabetes  Mother      Prior to Admission medications   Medication Sig Start Date End Date Taking? Authorizing Provider  Aspirin-Salicylamide-Caffeine (BC HEADACHE POWDER PO) Take 1 packet by mouth 2 (two) times daily as needed (mild pain/headache).   Yes [provider]  doxycycline (VIBRAMYCIN) 100 MG capsule Take 1 capsule (100 mg total) by mouth 2 (two) times daily. Patient not taking: Reported on 11/30/2017 07/02/17   Azalia Bilis, MD    Physical Exam: Vitals:   11/29/17 2209 11/30/17 0049  BP: 123/83   Pulse: 90 74  Resp: 20   Temp: 98.1 F (36.7 C)   TempSrc: Oral   SpO2: 98% 95%    Constitutional: NAD, calm, comfortable Eyes: PERRL, lids and conjunctivae normal ENMT: Mucous membranes are moist. Posterior pharynx clear of any exudate or lesions.Normal dentition.  Neck: normal, supple, no masses, no thyromegaly Respiratory: clear to auscultation bilaterally, no wheezing, no crackles. Normal respiratory effort. No accessory muscle use.  Cardiovascular: Regular rate and rhythm, no murmurs / rubs / gallops. No extremity edema. 2+ pedal pulses. No carotid bruits.  Abdomen: no tenderness, no masses palpated. No hepatosplenomegaly. Bowel sounds positive.  Musculoskeletal: no clubbing / cyanosis. No joint deformity upper and lower extremities. Good ROM, no contractures. Normal muscle tone.  Skin:    Neurologic: CN 2-12 grossly intact. Sensation intact, DTR normal. Strength 5/5 in all 4.  Psychiatric: Normal judgment and insight. Alert and  oriented x 3. Normal mood.    Labs on Admission: I have personally reviewed following labs and imaging studies  CBC: Recent Labs  Lab 11/29/17 2344  WBC 8.3  NEUTROABS 5.8  HGB 13.3  HCT 39.7  MCV 88.2  PLT 276   Basic Metabolic Panel: Recent Labs  Lab 11/29/17 2344  NA 139  K 3.6  CL 102  CO2 28  GLUCOSE 109*  BUN 14  CREATININE 0.99  CALCIUM 9.2   GFR: CrCl cannot be calculated (Unknown ideal weight.). Liver Function  Tests: No results for input(s): AST, ALT, ALKPHOS, BILITOT, PROT, ALBUMIN in the last 168 hours. No results for input(s): LIPASE, AMYLASE in the last 168 hours. No results for input(s): AMMONIA in the last 168 hours. Coagulation Profile: No results for input(s): INR, PROTIME in the last 168 hours. Cardiac Enzymes: No results for input(s): CKTOTAL, CKMB, CKMBINDEX, TROPONINI in the last 168 hours. BNP (last 3 results) No results for input(s): PROBNP in the last 8760 hours. HbA1C: No results for input(s): HGBA1C in the last 72 hours. CBG: No results for input(s): GLUCAP in the last 168 hours. Lipid Profile: No results for input(s): CHOL, HDL, LDLCALC, TRIG, CHOLHDL, LDLDIRECT in the last 72 hours. Thyroid Function Tests: No results for input(s): TSH, T4TOTAL, FREET4, T3FREE, THYROIDAB in the last 72 hours. Anemia Panel: No results for input(s): VITAMINB12, FOLATE, FERRITIN, TIBC, IRON, RETICCTPCT in the last 72 hours. Urine analysis:    Component Value Date/Time   COLORURINE YELLOW 10/24/2014 1306   APPEARANCEUR CLEAR 10/24/2014 1306   LABSPEC 1.038 (H) 10/24/2014 1306   PHURINE 5.5 10/24/2014 1306   GLUCOSEU NEGATIVE 10/24/2014 1306   HGBUR NEGATIVE 10/24/2014 1306   BILIRUBINUR NEGATIVE 10/24/2014 1306   KETONESUR NEGATIVE 10/24/2014 1306   PROTEINUR NEGATIVE 10/24/2014 1306   UROBILINOGEN 0.2 10/24/2014 1306   NITRITE NEGATIVE 10/24/2014 1306   LEUKOCYTESUR NEGATIVE 10/24/2014 1306    Radiological Exams on Admission: No results found.  EKG: Independently reviewed.  Assessment/Plan Principal Problem:   Abscess of forearm, left Active Problems:   IVDU (intravenous drug user)    1. L forearm abscess - 1. S/P superficial I+D by EDP 2. Cultures and GS pending 3. Started on doxy IV 4. Will switch to doxy PO  DVT prophylaxis: Lovenox Code Status: Full Family Communication: No family in room Disposition Plan: Home Consults called: None Admission status: Place in  obs   Hillary BowGARDNER, JARED M. DO [facility] Pager [phone]  If 7AM-7PM, please contact day team taking care of patient [email or web address] Password [email or web address]  11/30/2017, 2:02 AM

## 2017-12-01 ENCOUNTER — Telehealth: Payer: Self-pay

## 2017-12-01 NOTE — Telephone Encounter (Signed)
Attempted to reach patient to complete TCM and schedule hospital f/u. VM not available.  

## 2017-12-02 LAB — AEROBIC CULTURE  (SUPERFICIAL SPECIMEN)

## 2017-12-02 LAB — AEROBIC CULTURE W GRAM STAIN (SUPERFICIAL SPECIMEN)

## 2017-12-03 LAB — AEROBIC CULTURE  (SUPERFICIAL SPECIMEN)

## 2017-12-03 LAB — AEROBIC CULTURE W GRAM STAIN (SUPERFICIAL SPECIMEN)

## 2017-12-03 NOTE — Telephone Encounter (Signed)
Hi Jacob Newton, I tried to reach the patient at the [phone] & they told me I had the wrong number. I tried the other numbers and they were no longer in service.

## 2018-07-26 IMAGING — DX DG ELBOW 2V*L*
2 series · 2 of 2 positions shown · non-contrast
Comparison: 11/18/2009

CLINICAL DATA: Question foreign body question abscess

EXAM:
LEFT ELBOW - 2 VIEW

[elbow ap]
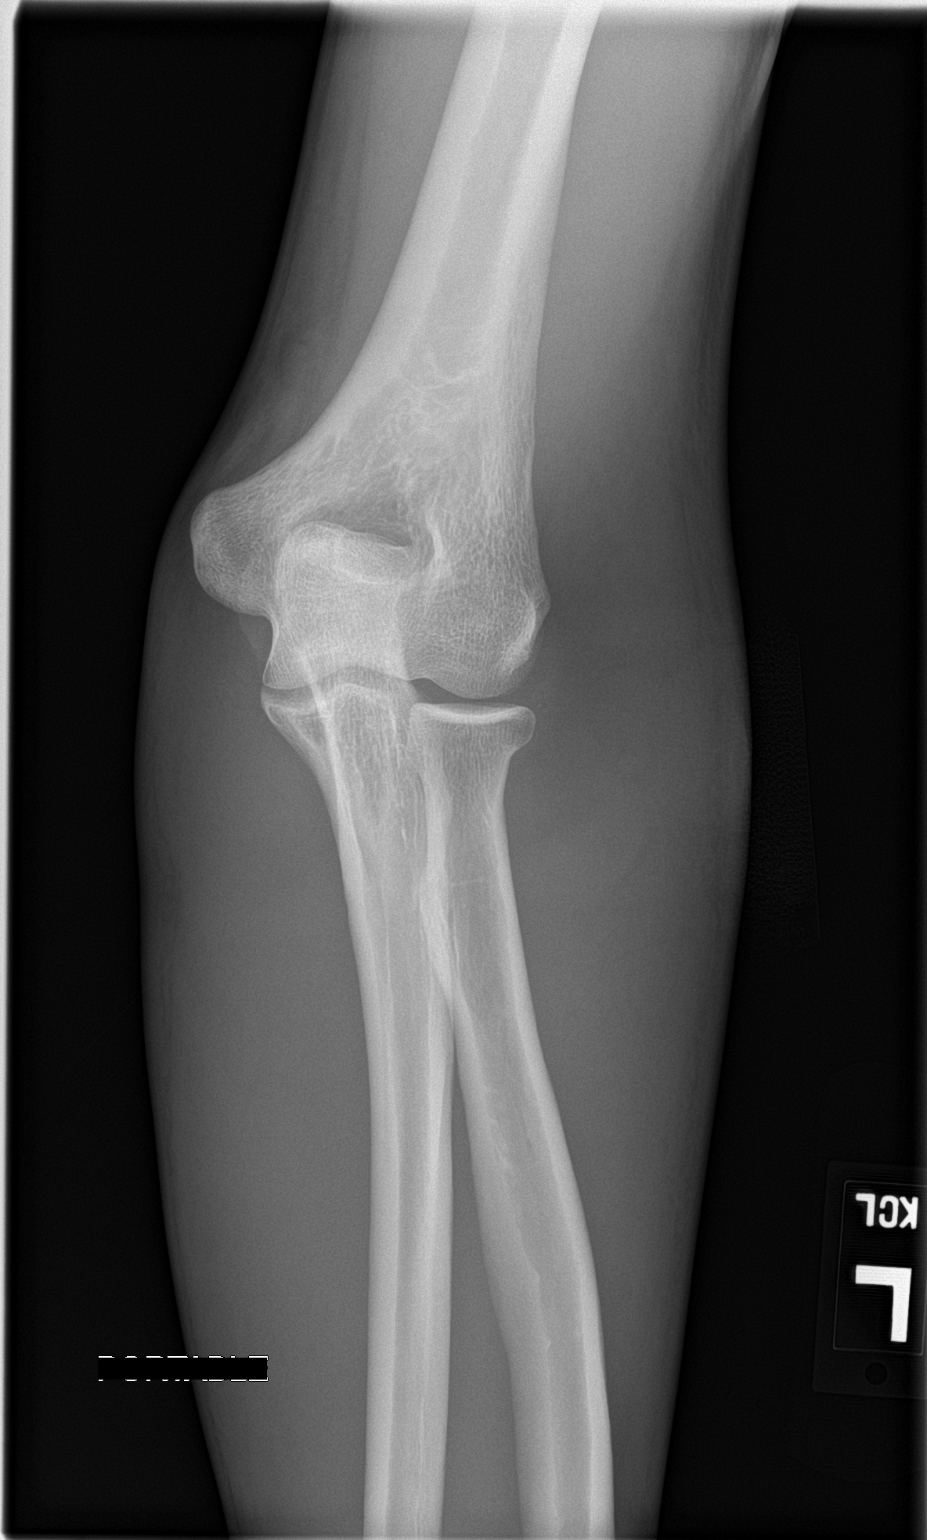

[elbow lat]
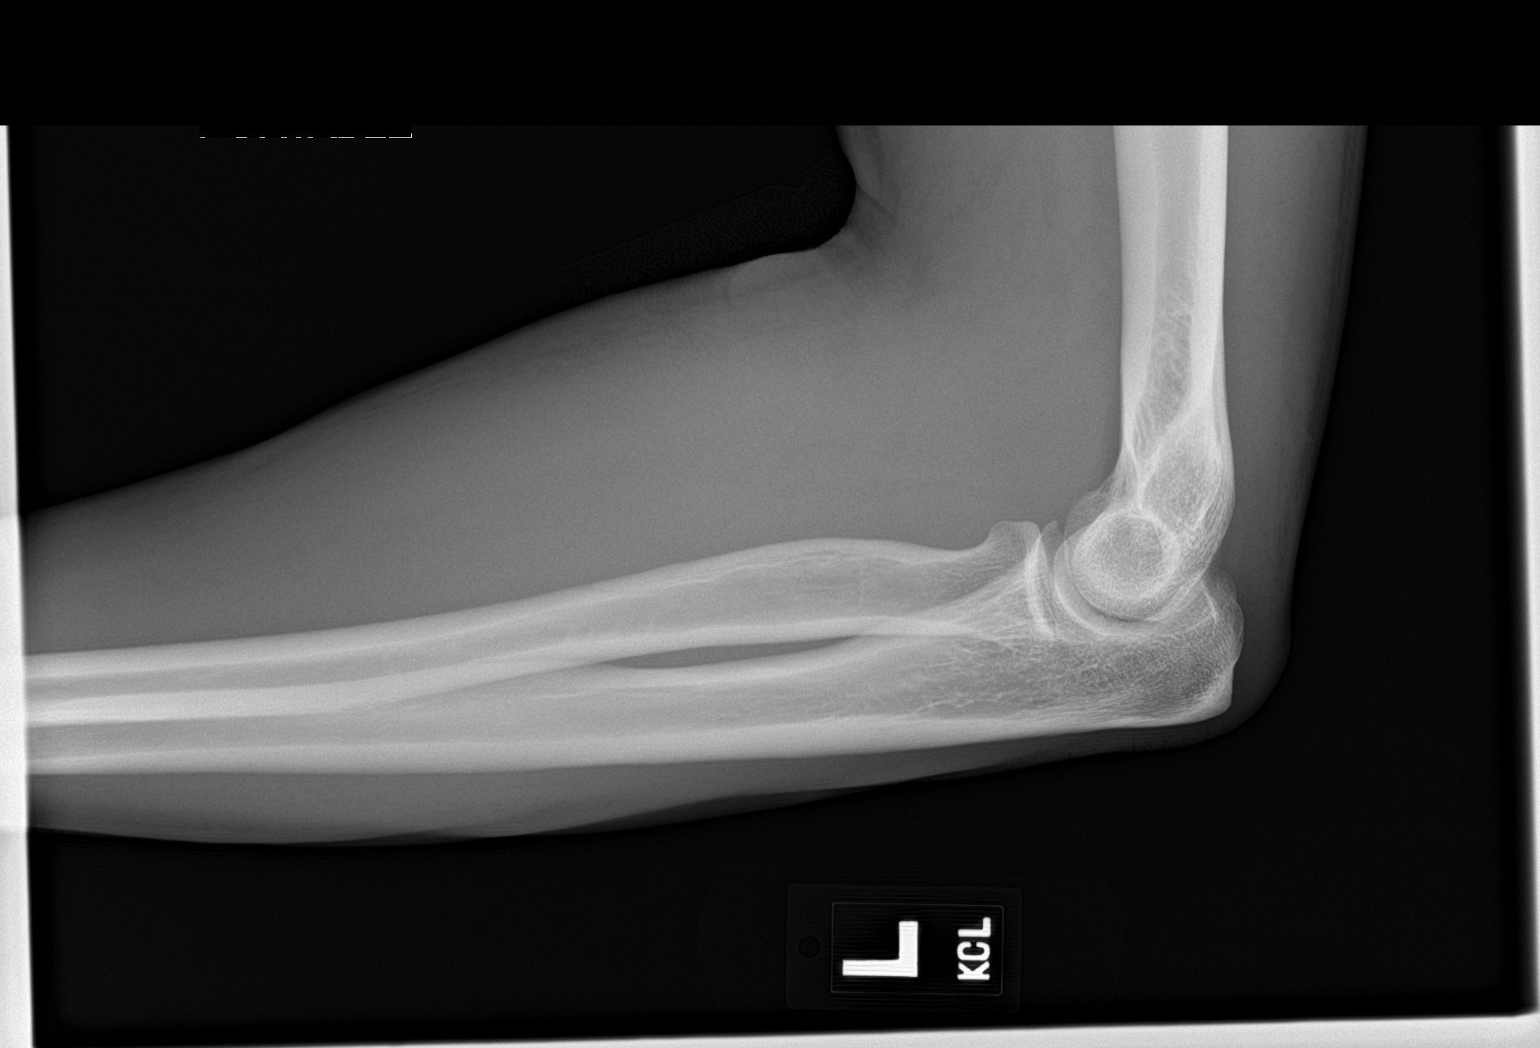

[2 of 2 positions shown; findings below may reference images not displayed]

FINDINGS: No fracture or malalignment. No significant elbow effusion. No
radiopaque foreign body. Soft tissue thickening proximal forearm.
IMPRESSION: 1. No acute osseous abnormality or radiopaque foreign body
2. Soft tissue thickening/swelling over the proximal forearm.
